# Patient Record
Sex: Male | Born: 1945 | Race: White | Hispanic: No | Marital: Married | Smoking: Never smoker
Health system: Southern US, Community
[De-identification: ages and names within clinical notes are randomized; demographics above are authoritative.]

## PROBLEM LIST (undated history)

## (undated) DIAGNOSIS — E785 Hyperlipidemia, unspecified: Secondary | ICD-10-CM

## (undated) DIAGNOSIS — C140 Malignant neoplasm of pharynx, unspecified: Secondary | ICD-10-CM

## (undated) DIAGNOSIS — I10 Essential (primary) hypertension: Secondary | ICD-10-CM

## (undated) DIAGNOSIS — I82409 Acute embolism and thrombosis of unspecified deep veins of unspecified lower extremity: Secondary | ICD-10-CM

## (undated) DIAGNOSIS — R569 Unspecified convulsions: Secondary | ICD-10-CM

## (undated) HISTORY — PX: CHOLECYSTECTOMY: SHX55

## (undated) HISTORY — PX: ORTHOPEDIC SURGERY: SHX850

---

## 2012-11-24 NOTE — Procedures (Signed)
 POST-OPERATIVE/POST-PROCEDURE NOTE Patient Name: Derek Booker Patient MRN: [id number] Patient DOB: 01/25/1946  Service date: 11/24/2012 PCP: LAMON ALTO POTTERS, MD  Surgeon: BERNARDINO ELSIE DECREE, MD Assistant Surgeon: None  Pre-operative diagnosis: Bladder tumor, BPH with obstruction Post-operative diagnosis: same  Procedure/description: TURBT (< 2 cm), TURP  Operative findings: Left lateral wall tumor (low grade appearing), Bilobar hypertrophy  Specimens: Left lateral wall bladder tumor, prostate chips  Fluids/Blood: LR  Estimated Blood Loss:  Minimal  Drains/Packs: 24 Fr 3-way catheter   Patient's condition: Stable    BERNARDINO ELSIE DECREE, MD 11/24/2012 8:09 AM

## 2013-07-31 DIAGNOSIS — I82409 Acute embolism and thrombosis of unspecified deep veins of unspecified lower extremity: Secondary | ICD-10-CM | POA: Insufficient documentation

## 2013-07-31 DIAGNOSIS — D751 Secondary polycythemia: Secondary | ICD-10-CM | POA: Insufficient documentation

## 2014-10-05 DIAGNOSIS — C109 Malignant neoplasm of oropharynx, unspecified: Secondary | ICD-10-CM | POA: Insufficient documentation

## 2014-11-09 DIAGNOSIS — F40240 Claustrophobia: Secondary | ICD-10-CM | POA: Insufficient documentation

## 2014-11-30 DIAGNOSIS — F331 Major depressive disorder, recurrent, moderate: Secondary | ICD-10-CM | POA: Insufficient documentation

## 2014-12-20 DIAGNOSIS — F410 Panic disorder [episodic paroxysmal anxiety] without agoraphobia: Secondary | ICD-10-CM | POA: Insufficient documentation

## 2016-06-02 DIAGNOSIS — M5481 Occipital neuralgia: Secondary | ICD-10-CM | POA: Insufficient documentation

## 2016-06-02 DIAGNOSIS — G43019 Migraine without aura, intractable, without status migrainosus: Secondary | ICD-10-CM | POA: Insufficient documentation

## 2016-12-02 ENCOUNTER — Encounter (HOSPITAL_COMMUNITY): Payer: Self-pay | Admitting: Emergency Medicine

## 2016-12-02 ENCOUNTER — Emergency Department (HOSPITAL_COMMUNITY): Payer: Medicare PPO

## 2016-12-02 ENCOUNTER — Observation Stay (HOSPITAL_COMMUNITY)
Admission: EM | Admit: 2016-12-02 | Discharge: 2016-12-03 | Disposition: A | Discharge status: Home / Self Care | Payer: Medicare PPO | Attending: Internal Medicine | Admitting: Internal Medicine

## 2016-12-02 DIAGNOSIS — Z7901 Long term (current) use of anticoagulants: Secondary | ICD-10-CM | POA: Diagnosis not present

## 2016-12-02 DIAGNOSIS — N400 Enlarged prostate without lower urinary tract symptoms: Secondary | ICD-10-CM | POA: Diagnosis present

## 2016-12-02 DIAGNOSIS — Z8669 Personal history of other diseases of the nervous system and sense organs: Secondary | ICD-10-CM

## 2016-12-02 DIAGNOSIS — Z7982 Long term (current) use of aspirin: Secondary | ICD-10-CM | POA: Diagnosis not present

## 2016-12-02 DIAGNOSIS — R072 Precordial pain: Principal | ICD-10-CM | POA: Insufficient documentation

## 2016-12-02 DIAGNOSIS — Z86718 Personal history of other venous thrombosis and embolism: Secondary | ICD-10-CM

## 2016-12-02 DIAGNOSIS — I10 Essential (primary) hypertension: Secondary | ICD-10-CM | POA: Diagnosis not present

## 2016-12-02 DIAGNOSIS — Z79899 Other long term (current) drug therapy: Secondary | ICD-10-CM | POA: Insufficient documentation

## 2016-12-02 DIAGNOSIS — Z85819 Personal history of malignant neoplasm of unspecified site of lip, oral cavity, and pharynx: Secondary | ICD-10-CM | POA: Diagnosis not present

## 2016-12-02 DIAGNOSIS — E039 Hypothyroidism, unspecified: Secondary | ICD-10-CM | POA: Diagnosis not present

## 2016-12-02 DIAGNOSIS — F329 Major depressive disorder, single episode, unspecified: Secondary | ICD-10-CM | POA: Diagnosis present

## 2016-12-02 DIAGNOSIS — E785 Hyperlipidemia, unspecified: Secondary | ICD-10-CM | POA: Diagnosis present

## 2016-12-02 DIAGNOSIS — R0789 Other chest pain: Secondary | ICD-10-CM

## 2016-12-02 DIAGNOSIS — R079 Chest pain, unspecified: Secondary | ICD-10-CM | POA: Diagnosis present

## 2016-12-02 DIAGNOSIS — I440 Atrioventricular block, first degree: Secondary | ICD-10-CM | POA: Diagnosis present

## 2016-12-02 DIAGNOSIS — F32A Depression, unspecified: Secondary | ICD-10-CM | POA: Diagnosis present

## 2016-12-02 HISTORY — DX: Essential (primary) hypertension: I10

## 2016-12-02 HISTORY — DX: Hyperlipidemia, unspecified: E78.5

## 2016-12-02 LAB — BASIC METABOLIC PANEL
Anion gap: 9 (ref 5–15)
BUN: 12 mg/dL (ref 6–20)
CO2: 24 mmol/L (ref 22–32)
CREATININE: 1.22 mg/dL (ref 0.61–1.24)
Calcium: 9.5 mg/dL (ref 8.9–10.3)
Chloride: 109 mmol/L (ref 101–111)
GFR, EST NON AFRICAN AMERICAN: 59 mL/min — AB (ref >60)
Glucose, Bld: 114 mg/dL — ABNORMAL HIGH (ref 65–99)
Potassium: 4.3 mmol/L (ref 3.5–5.1)
SODIUM: 142 mmol/L (ref 135–145)

## 2016-12-02 LAB — CBC
HCT: 43.7 % (ref 39.0–52.0)
Hemoglobin: 14.8 g/dL (ref 13.0–17.0)
MCH: 32.7 pg (ref 26.0–34.0)
MCHC: 33.9 g/dL (ref 30.0–36.0)
MCV: 96.5 fL (ref 78.0–100.0)
PLATELETS: 169 10*3/uL (ref 150–400)
RBC: 4.53 MIL/uL (ref 4.22–5.81)
RDW: 13.1 % (ref 11.5–15.5)
WBC: 4.4 10*3/uL (ref 4.0–10.5)

## 2016-12-02 LAB — I-STAT TROPONIN, ED: Troponin i, poc: 0 ng/mL (ref 0.00–0.08)

## 2016-12-02 LAB — TROPONIN I: Troponin I: <0.03 ng/mL (ref <0.03)

## 2016-12-02 MED ORDER — ASPIRIN 81 MG PO CHEW
81.0000 mg | CHEWABLE_TABLET | Freq: Every day | ORAL | Status: DC
  Administered 2016-12-03: 81 mg via ORAL
  Filled 2016-12-02: qty 1

## 2016-12-02 MED ORDER — SERTRALINE HCL 50 MG PO TABS
50.0000 mg | ORAL_TABLET | Freq: Every day | ORAL | Status: DC
  Administered 2016-12-03: 50 mg via ORAL
  Filled 2016-12-02: qty 1

## 2016-12-02 MED ORDER — PANTOPRAZOLE SODIUM 40 MG PO TBEC
40.0000 mg | DELAYED_RELEASE_TABLET | Freq: Every day | ORAL | Status: DC
  Administered 2016-12-03: 40 mg via ORAL
  Filled 2016-12-02: qty 1

## 2016-12-02 MED ORDER — SODIUM CHLORIDE 0.9% FLUSH: 3.0000 mL | Freq: Two times a day (BID) | INTRAVENOUS | Status: DC

## 2016-12-02 MED ORDER — ACETAMINOPHEN 325 MG PO TABS: 650.0000 mg | ORAL_TABLET | Freq: Four times a day (QID) | ORAL | Status: DC | PRN

## 2016-12-02 MED ORDER — TOPIRAMATE 100 MG PO TABS: 50.0000 mg | ORAL_TABLET | Freq: Every day | ORAL | Status: DC

## 2016-12-02 MED ORDER — NITROGLYCERIN 2 % TD OINT
1.0000 [in_us] | TOPICAL_OINTMENT | Freq: Once | TRANSDERMAL | Status: AC
  Administered 2016-12-02: 1 [in_us] via TOPICAL
  Filled 2016-12-02: qty 1

## 2016-12-02 MED ORDER — TOPIRAMATE 100 MG PO TABS
50.0000 mg | ORAL_TABLET | Freq: Every day | ORAL | Status: DC
  Administered 2016-12-02: 50 mg via ORAL
  Filled 2016-12-02: qty 1

## 2016-12-02 MED ORDER — PRAVASTATIN SODIUM 40 MG PO TABS
40.0000 mg | ORAL_TABLET | Freq: Every day | ORAL | Status: DC
  Administered 2016-12-03: 40 mg via ORAL
  Filled 2016-12-02: qty 1

## 2016-12-02 MED ORDER — BENAZEPRIL HCL 10 MG PO TABS
20.0000 mg | ORAL_TABLET | Freq: Every day | ORAL | Status: DC
Start: 2016-12-03 — End: 2016-12-03
  Administered 2016-12-03: 20 mg via ORAL
  Filled 2016-12-02: qty 2

## 2016-12-02 MED ORDER — ACETAMINOPHEN 650 MG RE SUPP: 650.0000 mg | Freq: Four times a day (QID) | RECTAL | Status: DC | PRN

## 2016-12-02 MED ORDER — RIVAROXABAN 20 MG PO TABS
20.0000 mg | ORAL_TABLET | Freq: Every day | ORAL | Status: DC
  Administered 2016-12-03: 20 mg via ORAL
  Filled 2016-12-02: qty 1

## 2016-12-02 MED ORDER — NITROGLYCERIN 0.4 MG SL SUBL
0.4000 mg | SUBLINGUAL_TABLET | SUBLINGUAL | Status: DC | PRN
  Administered 2016-12-02: 0.4 mg via SUBLINGUAL
  Filled 2016-12-02: qty 1

## 2016-12-02 MED ORDER — LEVOTHYROXINE SODIUM 112 MCG PO TABS
112.0000 ug | ORAL_TABLET | Freq: Every day | ORAL | Status: DC
  Administered 2016-12-03: 112 ug via ORAL
  Filled 2016-12-02 (×2): qty 1

## 2016-12-02 MED ORDER — AMLODIPINE BESYLATE 5 MG PO TABS
5.0000 mg | ORAL_TABLET | Freq: Every day | ORAL | Status: DC
  Administered 2016-12-03: 5 mg via ORAL
  Filled 2016-12-02: qty 1

## 2016-12-02 MED ORDER — SUMATRIPTAN SUCCINATE 100 MG PO TABS
100.0000 mg | ORAL_TABLET | Freq: Once | ORAL | Status: AC
  Administered 2016-12-02: 100 mg via ORAL
  Filled 2016-12-02: qty 1

## 2016-12-02 MED ORDER — DARIFENACIN HYDROBROMIDE ER 15 MG PO TB24
15.0000 mg | ORAL_TABLET | Freq: Every day | ORAL | Status: DC
  Administered 2016-12-03: 15 mg via ORAL
  Filled 2016-12-02: qty 1

## 2016-12-02 NOTE — ED Provider Notes (Signed)
[facility]-EMERGENCY DEPT Provider Note   CSN: [id number] Arrival date & time: 12/02/16  4098     History   Chief Complaint Chief Complaint  Patient presents with  . Chest Pain    HPI Derek Booker is a 71 y.o. male.  HPI   71 yo M with PMHx HTN, HLD, family h/o CAD here wth left sided CP. Pt states he was in usual state of health until this morning. He woke up, walked to the bathroom, and walked to bed around 5:30 AM. Upon returning to bed, he had gradual onset of an aching, dull, crushing substernal CP with radiation to his left arm. He had associated SOB but no nausea or vomiting. He continues to have mild, 1/10 chest pain at this time. He sleeps on his left side and thought this was muscular but is different from any MSK pain he has had before. No LE edema. No h/o heart disease. Of note, family does have h/o aortic disease and heart disease. Denies any alleviating factors. His pain did seem to worsen with exertion and movement.  Past Medical History:  Diagnosis Date  . Hyperlipidemia   . Hypertension     Patient Active Problem List   Diagnosis Date Noted  . Chest pain 12/02/2016    Past Surgical History:  Procedure Laterality Date  . CHOLECYSTECTOMY    . ORTHOPEDIC SURGERY         Home Medications    Prior to Admission medications   Medication Sig Start Date End Date Taking? Authorizing Provider  amLODipine-benazepril (LOTREL) 5-20 MG capsule Take 1 capsule by mouth daily.   Yes Historical Provider, MD  aspirin 81 MG chewable tablet Chew 81 mg by mouth daily.   Yes Historical Provider, MD  levothyroxine (SYNTHROID, LEVOTHROID) 112 MCG tablet Take 112 mcg by mouth daily before breakfast.   Yes Historical Provider, MD  omeprazole (PRILOSEC) 20 MG capsule Take 20 mg by mouth daily.   Yes Historical Provider, MD  pravastatin (PRAVACHOL) 40 MG tablet Take 40 mg by mouth daily.   Yes Historical Provider, MD  rivaroxaban (XARELTO) 20 MG TABS tablet Take 20 mg by mouth daily.    Yes Historical Provider, MD  sertraline (ZOLOFT) 50 MG tablet Take 50 mg by mouth daily.   Yes Historical Provider, MD  solifenacin (VESICARE) 5 MG tablet Take 5 mg by mouth daily.   Yes Historical Provider, MD  topiramate (TOPAMAX) 50 MG tablet Take 50 mg by mouth daily.   Yes Historical Provider, MD    Family History History reviewed. No pertinent family history.  Social History Social History  Substance Use Topics  . Smoking status: Never Smoker  . Smokeless tobacco: Never Used  . Alcohol use No     Allergies   Nitrates, organic   Review of Systems Review of Systems  Constitutional: Negative for chills, fatigue and fever.  HENT: Negative for congestion and rhinorrhea.   Eyes: Negative for visual disturbance.  Respiratory: Positive for chest tightness and shortness of breath. Negative for cough and wheezing.   Cardiovascular: Positive for chest pain. Negative for leg swelling.  Gastrointestinal: Negative for abdominal pain, diarrhea, nausea and vomiting.  Genitourinary: Negative for dysuria and flank pain.  Musculoskeletal: Negative for neck pain and neck stiffness.  Skin: Negative for rash and wound.  Allergic/Immunologic: Negative for immunocompromised state.  Neurological: Negative for syncope, weakness and headaches.  All other systems reviewed and are negative.    Physical Exam Updated Vital Signs BP 136/90 (BP  Location: Right Arm)   Pulse 63   Temp 97.7 F (36.5 C) (Oral)   Resp 16   Ht 6' (1.829 m)   Wt 190 lb (86.2 kg)   SpO2 100%   BMI 25.77 kg/m   Physical Exam  Constitutional: He is oriented to person, place, and time. He appears well-developed and well-nourished. No distress.  HENT:  Head: Normocephalic and atraumatic.  Eyes: Conjunctivae are normal.  Neck: Neck supple.  Cardiovascular: Normal rate, regular rhythm and normal heart sounds.  Exam reveals no friction rub.   No murmur heard. Pulmonary/Chest: Effort normal and breath sounds normal.  No respiratory distress. He has no wheezes. He has no rales.  Abdominal: He exhibits no distension.  Musculoskeletal: He exhibits no edema.  Neurological: He is alert and oriented to person, place, and time. He exhibits normal muscle tone.  Skin: Skin is warm. Capillary refill takes less than 2 seconds.  Psychiatric: He has a normal mood and affect.  Nursing note and vitals reviewed.    ED Treatments / Results  Labs (all labs ordered are listed, but only abnormal results are displayed) Labs Reviewed  BASIC METABOLIC PANEL - Abnormal; Notable for the following:       Result Value   Glucose, Bld 114 (*)    GFR calc non Af Amer 59 (*)    All other components within normal limits  CBC  TROPONIN I  I-STAT TROPOININ, ED    EKG  EKG Interpretation  Date/Time:  Wednesday December 02 2016 07:12:25 EDT Ventricular Rate:  54 PR Interval:    QRS Duration: 95 QT Interval:  448 QTC Calculation: 425 R Axis:   16 Text Interpretation:  Sinus rhythm Prolonged PR interval DELAYED R WAVE PROGRESSION Otherwise no acute ST elevations or depressions Confirmed by Erma Heritage MD, Sheria Lang [id number]) on 12/02/2016 7:18:55 AM       Radiology Dg Chest 2 View  Result Date: 12/02/2016 CLINICAL DATA:  71 year old with acute onset of left-sided chest pain radiating into the left shoulder and arm associated with shortness of breath. Symptoms began approximately 2 hours ago. Nonsmoker. EXAM: CHEST  2 VIEW COMPARISON:  None. FINDINGS: Cardiomediastinal silhouette unremarkable. Left apical pleuroparenchymal opacity likely due to scarring. Lungs otherwise clear. Bronchovascular markings normal. Pulmonary vascularity normal. No pleural effusions. No pneumothorax. Degenerative changes involving the thoracic spine. IMPRESSION: 1. Left apical pleuroparenchymal opacity likely representing scarring. In the absence of prior examinations for comparison, a follow-up chest x-ray in 2-3 months is recommended to confirm stability. 2.   No acute cardiopulmonary disease. Electronically Signed   By: Hulan Saas M.D.   On: 12/02/2016 07:33    Procedures Procedures (including critical care time)  Medications Ordered in ED Medications  nitroGLYCERIN (NITROSTAT) SL tablet 0.4 mg (0.4 mg Sublingual Given 12/02/16 0741)  nitroGLYCERIN (NITROGLYN) 2 % ointment 1 inch (1 inch Topical Given 12/02/16 0857)     Initial Impression / Assessment and Plan / ED Course  I have reviewed the triage vital signs and the nursing notes.  Pertinent labs & imaging results that were available during my care of the patient were reviewed by me and considered in my medical decision making (see chart for details).    71 yo M with PMHx of HTN, HLD here with left-sided CP radiating to his arm. Pt hypertensive, bradycardic on arrival. EKG non-ischemic but does show some non-specific ST changes in inferior and anterior leads. No overt ST elevations or depressions. Lab work unremarkable and initial trop neg.  CXR with possible scarring left apex, o/w unremarkable. No PTX. HEAR score is 5 - will likely need provocative testing and rule out. Pain improved with nitro. Will admit to IM Teaching for high-risk CP rule out.  Final Clinical Impressions(s) / ED Diagnoses   Final diagnoses:  Other chest pain  Essential hypertension    New Prescriptions New Prescriptions   No medications on file     Shaune Pollack, MD 12/02/16 [phone]

## 2016-12-02 NOTE — H&P (Signed)
Date: 12/02/2016               Patient Name:  Derek Booker MRN: [id number]  DOB: 1945-10-29 Age / Sex: 71 y.o.,  male   PCP: Pcp Not In System         Medical Service: Internal Medicine Teaching Service         Attending Physician: Dr. Earl Lagos, MD    First Contact: Dr. Mikey Bussing Pager: [phone]  Second Contact: Dr. 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  Patient states at 2 AM he awoke from sleep to use the restroom. When he laid back down in bed, he experienced an intense pain in his left proximal anterior medial arm radiating superiorly and down into his left medial chest. He cannot describe the pain more than "intense." The pain was not associated with diaphoresis, nausea, vomiting, palpitations, lightheadedness or shortness of breath. Pain was constant and has not fully resolved. He is unable to determine what makes the pain worse. He denies worsening of the pain with inspiration or with movement. Pain was reproducible when the EDP palpated the chest wall and arm. Pain did improve with aspirin and nitroglycerin. At its onset, pain was intense at a 6 or 7/10. Patient received aspirin 325 mg with improvement of his pain to a 2/10. After receiving nitroglycerin in the emergency department, pain again improved to a 1/10. Patient states that the night before last, he had an episode of similar left arm pain when he was laying on his left shoulder during sleep. The pain at that time was a 1/10. As soon as he turned over and remove pressure from the left side,  the pain resolved. He states that this episode was similar to what he experienced last night, but was not as intense. Patient denies pleuritic chest pain, shortness of breath, lower extremity edema or pain, upper extremity edema. He reports compliance with his daily Xarelto. He denies fever, cough, congestion, dysphagia, reflux or abdominal pain. Patient does have a history of arthritis and has undergone several shoulder arthroscopies and bilateral knee partial replacement in the past. He denies any new activities or exercises.  Patient has a history of hypertension, hyperlipidemia. He is a nonsmoker. He has a family history of atherosclerosis as both his father and his uncle both died from aortic aneurysm ruptures. He is not aware if they had CAD. Patient is fairly active at baseline, but is limited with shortness of breath. He exercises, walks frequently, lifts weights, does pushups and will run occasionally. He states that shortness of breath with exertion will limit him, but improves with rest. He denies ever experiencing chest pain with exertion or at rest. Left and right heart catheterization was performed in January 2015 given abnormal stress test showing inferior ischemia, intermediate risk. Heart catheterization showed no significant coronary artery disease. Torturous vessel, likely secondary to hypertension. Normal LV systolic function. Ejection fraction 60%. Echocardiogram from March 2018 showed normal wall motion with normal LV systolic function with LVEF of 55-60%. Borderline concentric  LVH. Grade 1 diastolic dysfunction. Normal right heart size and right ventricle function normal valvular apparatus. Mildly sclerotic aortic valve without stenosis. Trace aortic regurgitation. Trace mitral regurgitation. Trace tricuspid regurgitation. No evidence of pericardial effusion. Patient was recently seen by his cardiologist on 10/26/2016 and at that time reported no chest pain and no change in dyspnea.  Cardiology recommended continuing current medications given good control. EKG was noted to be sinus rhythm with ST elevations due to repolarization changes which was similar to priors.  In the emergency department, patient was afebrile, hypertensive at 160/107, heart rate 56, respiratory rate 13, satting 100% on room air. After nitroglycerin, blood pressure improved to 136/90. Basic metabolic panel and CBC were within normal limits. Troponin was negative. EKG was sinus rhythm without ST or T-wave changes, but with 1st degree AV block. Chest x-ray showed and opacity of the left apical pleuroparenchymal likely representing scarring. Patient received aspirin 325 mg, nitroglycerin and nitroglycerin paste.  Meds: Current Facility-Administered Medications  Medication Dose Route Frequency Provider Last Rate Last Dose  . acetaminophen (TYLENOL) tablet 650 mg  650 mg Oral Q6H PRN Batu Cassin Lucrezia Starch, MD       Or  . acetaminophen (TYLENOL) suppository 650 mg  650 mg Rectal Q6H PRN Wyolene Weimann Lucrezia Starch, MD      . nitroGLYCERIN (NITROSTAT) SL tablet 0.4 mg  0.4 mg Sublingual Q5 min PRN Shaune Pollack, MD   0.4 mg at 12/02/16 0741  . [START ON 12/03/2016] topiramate (TOPAMAX) tablet 50 mg  50 mg Oral Daily Davonne Jarnigan Lucrezia Starch, MD       Current Outpatient Prescriptions  Medication Sig Dispense Refill  . amLODipine-benazepril (LOTREL) 5-20 MG capsule Take 1 capsule by mouth daily.    Marland Kitchen aspirin 81 MG chewable tablet Chew 81 mg by mouth daily.    Marland Kitchen levothyroxine (SYNTHROID, LEVOTHROID) 112 MCG tablet Take 112 mcg by mouth daily before breakfast.    . omeprazole (PRILOSEC) 20 MG capsule Take 20 mg by mouth daily.    . pravastatin (PRAVACHOL) 40 MG tablet Take 40 mg by mouth daily.    . rivaroxaban (XARELTO) 20 MG TABS tablet Take 20 mg by mouth daily.    . sertraline (ZOLOFT) 50 MG tablet Take 50 mg by mouth daily.    . solifenacin (VESICARE) 5 MG tablet Take 5 mg by mouth daily.    Marland Kitchen topiramate (TOPAMAX) 50 MG tablet Take 50 mg  by mouth daily.      Allergies: Allergies as of 12/02/2016 - Review Complete 12/02/2016  Allergen Reaction Noted  . Nitrates, organic Other (See Comments) 12/02/2016   Past Medical History:  Diagnosis Date  . Hyperlipidemia   . Hypertension     Family History:  Father: Aortic aneurysm rupture Uncle: Aortic aneurysm rupture  Social History:  Tobacco Use: Denies Alcohol Use: Denies Illicit Drug Use: Denies  Review of Systems: A complete ROS was reviewed and negative except as per HPI.   Physical Exam: Blood pressure 123/88, pulse 60, temperature 97.7 F (36.5 C), temperature source Oral, resp. rate 13, height 6' (1.829 m), weight 190 lb (86.2 kg), SpO2 98 %. General: Vital signs reviewed.  Patient is in no acute distress and cooperative with exam.  Eyes: PERRL, conjunctivae normal, no scleral icterus.  Ears, Nose, Throat, and Mouth: Normal bilateral tympanic membranes. Normal bilateral nasal turbinates. Normal posterior oropharynx. Moist mucous membranes.  Cardiovascular: RRR,  no murmurs, gallops, or rubs. No JVD or carotid bruit present. No lower extremity edema bilaterally.  Bilateral radial and pedal pulses are intact and symmetric bilaterally. Normal capillary refill.  Pulmonary: Clear to auscultation bilaterally, no wheezes, rales, or rhonchi. No accessory muscle use. Gastrointestinal: Soft, non-tender, non-distended, BS +, no guarding present.  Musculoskeletal: No erythema, non-tender on palpation of left inner arm and chest wall. Neurologic: Awake and alert, answers all questions appropriately, moves all extremities equally. Strength is normal and symmetric bilaterally in upper extremities, sensory intact to light touch bilaterally.  Skin: Warm, dry and intact. No rashes or erythema. Psychiatric: Normal mood and affect. speech and behavior is normal. Cognition and memory are normal.   EKG: 1st degree AV Block. NSR. No ST changes or TWI.  CXR:  Left apical  pleuroparenchymal opacity likely representing scarring. In the absence of prior examinations for comparison, a follow-up chest x-ray in 2-3 months is recommended to confirm stability. No widening of the mediastinum.   Exercise Stress Test prior to January 2015: abnormal stress test showing inferior ischemia, intermediate risk.  Left and right heart catheterization January 2015: Heart catheterization showed no significant coronary artery disease. Torturous vessel, likely secondary to hypertension. Normal LV systolic function. Ejection fraction 60%.  Echocardiogram from March 2018: normal wall motion with normal LV systolic function with LVEF of 55-60%. Borderline concentric LVH. Grade 1 diastolic dysfunction. Normal right heart size and right ventricle function normal valvular apparatus. Mildly sclerotic aortic valve without stenosis. Trace aortic regurgitation. Trace mitral regurgitation. Trace tricuspid regurgitation. No evidence of pericardial effusion.  Assessment & Plan by Problem: Principal Problem:   Chest pain Active Problems:   HTN (hypertension)   HLD (hyperlipidemia)   Hypothyroidism   History of throat cancer   History of recurrent deep vein thrombosis (DVT)   BPH (benign prostatic hyperplasia)   Depression   History of migraine   1st degree AV block  Mr. Keetch is a 71 M with PMHx of HTN, HLD, BPH s/p TURP, Throat Cancer s/p Radiation in 2016, Hypothyroidism, GERD, Depression, H/o recurrent DVT on Xarelto, and Migraine Headaches who presents to the ED with complaint of left arm pain.  Left Arm and Chest Pain: Patient presents with anterior medial proximal left arm pain radiating to the proximal medial left chest wall while laying in bed and not associated with shortness of breath, diaphoresis, nausea, vomiting or lightheadedness. No history of chest pain. Heart catheterization in January 2015 showed no significant coronary artery disease. Echocardiogram in March 2018 showed no  wall motion abnormalities and a normal left ventricular injection fraction. Troponin negative. EKG without ischemic changes. Doubt pulmonary embolism, pneumonia, pneumothorax, aortic dissection. Heart score 5. Overall, patient's presentation is very atypical of ACS, but he does have several risk factors. Will admit patient for observation, troponin trend and repeat EKG tomorrow morning. Given patient's recent heart catheterization, we'll discuss if he requires stress testing. -Observation for chest pain rule out -Trend troponins -Repeat EKG tomorrow morning -Continue aspirin 81 mg daily -Continue Nitropaste and nitroglycerin when necessary -Obtain fasting lipid panel and hemoglobin A1c  Apical scarring on chest x-ray: Chest x-ray showed consolidation in left apical pleuroparenchyma, likely representing scarring. Recommendations are to repeat chest x-ray in 2-3 months. I discussed this with the patient and his family. Patient denies cough, shortness of breath, hemoptysis. -Upon returning to [place], patient should have a repeat chest x-ray in 2-3 months  HTN: Patient has a history of hypertension and is on amlodipine 5 mg daily and benazepril 20 mg daily at home. Patient was hypertensive on arrival at 160/107 which has  improved to 136/90. -Continue amlodipine 5 mg daily -Continue benazepril 20 mg daily -Nitroglycerin paste  HLD: Patient has a history of hyperlipidemia and is on pravastatin 40 mg daily. -Calculate ASCVD -Continue pravastatin 40 mg daily -Check a morning lipid panel  Hypothyroidism: Patient has a history of hypothyroidism and is on Synthroid 112 g daily. 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 Patient is status post radiation therapy and has done well. Patient follows closely with his oncologist in [place] and reports he recently had a  scope one month ago which showed no evidence of recurrence. He denies dysphagia.  BPH s/p TURP: Patient has a history of BPH and is status post TURP in 2014. Patient is on Vesicare 10 mg daily at home. Patient has chronic urinary hesitancy which is unchanged. He denies dysuria. -Continue formulary version of Vesicare 10 mg daily while admitted  H/o Recurrent DVT on Xarelto: Patient has a history of DVT, first diagnosed 20-25 years ago in his left lower extremity. Patient had a recurrence of DVT in his left lower extremity 2-3 years ago prior to the diagnosis of his throat cancer. Patient has remained on Xarelto 20 mg daily since diagnosis and reports good compliance. He denies history of pulmonary embolism. He denies lower extremity or upper extremity edema, denies shortness of breath, denies pleuritic chest pain. Physical exam shows no evidence of edema, erythema or tenderness of palpation in all 4 extremities. Doubt recurrence of DVT or PE. -Continue Xarelto 20 mg daily  Depression: Patient is on sertraline 50 mg daily at home. -Continue sertraline 50 mg daily  GERD: Patient is on omeprazole 20 mg daily at home. -Protonix 40 mg daily  Migraine History: -Continue home Topiramate   DVT/PE ppx: Xarelto FEN: HH CODE: FULL  Dispo: Admit patient to Observation with expected length of stay less than 2 midnights.  Signed: Karlene Lineman, DO PGY-3 Internal Medicine Resident Pager # [phone] 12/02/2016 10:51 AM

## 2016-12-02 NOTE — ED Triage Notes (Signed)
Pt c/o 2/10 cp radiating to his left arm that started today, pt got 324 mg ASA pta to ED.

## 2016-12-03 DIAGNOSIS — I440 Atrioventricular block, first degree: Secondary | ICD-10-CM

## 2016-12-03 DIAGNOSIS — R0789 Other chest pain: Secondary | ICD-10-CM | POA: Diagnosis not present

## 2016-12-03 DIAGNOSIS — Z86718 Personal history of other venous thrombosis and embolism: Secondary | ICD-10-CM

## 2016-12-03 DIAGNOSIS — E039 Hypothyroidism, unspecified: Secondary | ICD-10-CM | POA: Diagnosis not present

## 2016-12-03 DIAGNOSIS — M79602 Pain in left arm: Secondary | ICD-10-CM | POA: Diagnosis not present

## 2016-12-03 DIAGNOSIS — Z85818 Personal history of malignant neoplasm of other sites of lip, oral cavity, and pharynx: Secondary | ICD-10-CM

## 2016-12-03 DIAGNOSIS — G43909 Migraine, unspecified, not intractable, without status migrainosus: Secondary | ICD-10-CM | POA: Diagnosis not present

## 2016-12-03 DIAGNOSIS — N400 Enlarged prostate without lower urinary tract symptoms: Secondary | ICD-10-CM | POA: Diagnosis not present

## 2016-12-03 DIAGNOSIS — R072 Precordial pain: Secondary | ICD-10-CM | POA: Diagnosis not present

## 2016-12-03 DIAGNOSIS — I10 Essential (primary) hypertension: Secondary | ICD-10-CM

## 2016-12-03 DIAGNOSIS — Z7901 Long term (current) use of anticoagulants: Secondary | ICD-10-CM | POA: Diagnosis not present

## 2016-12-03 DIAGNOSIS — E785 Hyperlipidemia, unspecified: Secondary | ICD-10-CM

## 2016-12-03 DIAGNOSIS — F329 Major depressive disorder, single episode, unspecified: Secondary | ICD-10-CM | POA: Diagnosis not present

## 2016-12-03 DIAGNOSIS — Z923 Personal history of irradiation: Secondary | ICD-10-CM

## 2016-12-03 DIAGNOSIS — Z79899 Other long term (current) drug therapy: Secondary | ICD-10-CM

## 2016-12-03 DIAGNOSIS — Z888 Allergy status to other drugs, medicaments and biological substances status: Secondary | ICD-10-CM

## 2016-12-03 LAB — LIPID PANEL
CHOL/HDL RATIO: 3.3 ratio
Cholesterol: 130 mg/dL (ref 0–200)
HDL: 40 mg/dL — AB (ref >40)
LDL CALC: 73 mg/dL (ref 0–99)
Triglycerides: 87 mg/dL (ref <150)
VLDL: 17 mg/dL (ref 0–40)

## 2016-12-03 LAB — BASIC METABOLIC PANEL
Anion gap: 6 (ref 5–15)
BUN: 12 mg/dL (ref 6–20)
CHLORIDE: 110 mmol/L (ref 101–111)
CO2: 23 mmol/L (ref 22–32)
CREATININE: 1.25 mg/dL — AB (ref 0.61–1.24)
Calcium: 9.2 mg/dL (ref 8.9–10.3)
GFR calc non Af Amer: 57 mL/min — ABNORMAL LOW (ref >60)
Glucose, Bld: 107 mg/dL — ABNORMAL HIGH (ref 65–99)
POTASSIUM: 3.8 mmol/L (ref 3.5–5.1)
Sodium: 139 mmol/L (ref 135–145)

## 2016-12-03 LAB — CBC
HEMATOCRIT: 40.5 % (ref 39.0–52.0)
Hemoglobin: 13.6 g/dL (ref 13.0–17.0)
MCH: 32.6 pg (ref 26.0–34.0)
MCHC: 33.6 g/dL (ref 30.0–36.0)
MCV: 97.1 fL (ref 78.0–100.0)
PLATELETS: 168 10*3/uL (ref 150–400)
RBC: 4.17 MIL/uL — AB (ref 4.22–5.81)
RDW: 13.5 % (ref 11.5–15.5)
WBC: 3.9 10*3/uL — AB (ref 4.0–10.5)

## 2016-12-03 LAB — HEMOGLOBIN A1C
HEMOGLOBIN A1C: 5.5 % (ref 4.8–5.6)
MEAN PLASMA GLUCOSE: 111 mg/dL

## 2016-12-03 NOTE — Progress Notes (Signed)
   Subjective: Patient was evaluated this morning on rounds. He denies any left arm pain or chest pain and said it had resolved overnight. He denies headaches at this time.  Objective:  Vital signs in last 24 hours: Vitals:   12/02/16 1158 12/02/16 1441 12/02/16 2130 12/03/16 0637  BP: (!) 131/100 (!) 149/94 116/87 131/87  Pulse: 67 (!) 57 (!) 55 (!) 52  Resp: 16 16    Temp: 97.8 F (36.6 C) 98.3 F (36.8 C) 98.4 F (36.9 C) 98.7 F (37.1 C)  TempSrc: Oral Oral Oral Oral  SpO2: 98% 100% 99% 100%  Weight: 187 lb 11.2 oz (85.1 kg)   186 lb 1.6 oz (84.4 kg)  Height: 6' (1.829 m)      Physical Exam  Constitutional: He is well-developed, well-nourished, and in no distress.  Cardiovascular: Normal rate, regular rhythm and normal heart sounds.  Exam reveals no gallop and no friction rub.   No murmur heard. Pulmonary/Chest: Effort normal and breath sounds normal. No respiratory distress. He has no wheezes. He has no rales.  Abdominal: Soft. He exhibits no distension.  Musculoskeletal: He exhibits no edema.  Skin: Skin is warm and dry.     Assessment/Plan:  Principal Problem:   Chest pain Active Problems:   HTN (hypertension)   HLD (hyperlipidemia)   Hypothyroidism   History of throat cancer   History of recurrent deep vein thrombosis (DVT)   BPH (benign prostatic hyperplasia)   Depression   History of migraine   1st degree AV block  Left Arm and Chest Pain Resolved overnight.  Troponins were negative X 3. EKG this morning was sinus rhythm without ST or T-wave changes, with 1st degree AV block, EKG unchanged from yesterday.  Pain most likely is musculoskeletal in nature.  Advised patient to follow up with PCP and cardiologist on discharge.   Apical scarring on chest x-ray Chest x-ray showed consolidation in left apical pleuroparenchyma, likely representing scarring. Recommendations are to repeat chest x-ray in 2-3 months. This was discussed once again with family.  -Upon  returning to [place], patient should have a repeat chest x-ray in 2-3 months  HTN Normotensive -Continue amlodipine 5 mg daily -Continue benazepril 20 mg daily  HLD Patient has a history of hyperlipidemia and is on pravastatin 40 mg daily.  LDL is 73 and total cholesterol is 130 on this admission -Continue pravastatin 40 mg daily  Hypothyroidism Patient has a history of hypothyroidism and is on Synthroid 112 g daily. 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 Patient is status post radiation therapy and has done well. Patient follows closely with his oncologist in [place] and reports he recently had a scope one month ago which showed no evidence of recurrence. He denies dysphagia.  BPH s/p TURP -Continue formulary version of Vesicare 10 mg daily while admitted  H/o Recurrent DVT on Xarelto:  No signs of DVT -Continue Xarelto 20 mg daily  Depression Patient is on sertraline 50 mg daily at home. -Continue sertraline 50 mg daily  GERD Patient is on omeprazole 20 mg daily at home. -Protonix 40 mg daily  Migraine History: Patient reports having a migraine yesterday and was given his home imitrex with relief.   - Imitrex  -Continue home Topiramate   Dispo: Anticipated discharge today.   Camelia Phenes, DO 12/03/2016, 12:03 PM Pager: [phone]

## 2016-12-03 NOTE — Progress Notes (Signed)
  Date: 12/03/2016  Patient name: Derek Booker  Medical record number: [id number]  Date of birth: November 30, 1945   I have seen and evaluated Derek Booker and discussed their care with the Residency Team. In brief, patient is a 71 year old male with a past medical history of hypertension, hyperlipidemia, BPH status post TURP, oropharyngeal cancer status post radiation in 2016, hypothyroidism, GERD, depression, recurrent DVT on anticoagulation and migraines who presented to the ED with left arm pain and chest pain 1 day.  Patient was feeling well until the day of admission he woke up in the middle of the night to use restroom and developed a sudden onset of left proximal arm pain which radiated to his left chest. Patient states that the pain was intense and was 7-8 out of 10, constant, no relation to inspiration, no associated diaphoresis, no nausea or vomiting, no palpitations, no shortness of breath, no syncope, no focal weakness. Patient did state that his pain improved after coming to the emergency department and receiving aspirin and nitroglycerin. Patient did have a similar episode of arm pain the day prior to admission which resolved as soon as he turned over. Patient denies fevers or chills, no recent URI symptoms, no swelling in his arm. Of note, patient had a coronary angiogram in January 2015 (after having an abnormal stress test showing intermediate risk) which showed no significant coronary disease. Patient also had an echocardiogram done in March 2018 which showed normal wall motion with normal LV systolic function with an LVEF of 55-60%.  Today patient feels well with no new complaints. States that his pain is completely resolved.  PMHx, Fam Hx, and/or Soc Hx : As per resident admit note  Vitals:   12/02/16 2130 12/03/16 0637  BP: 116/87 131/87  Pulse: (!) 55 (!) 52  Resp:    Temp: 98.4 F (36.9 C) 98.7 F (37.1 C)   Gen.: Awake alert and oriented 3, NAD CVS: Regular rate and rhythm,  normal heart sounds Lungs: CTA bilaterally Abdomen: Soft, nontender, nondistended, normoactive bowel sounds Extremities: No edema  Assessment and Plan: I have seen and evaluated the patient as outlined above. I agree with the formulated Assessment and Plan as detailed in the residents' note, with the following changes:   1. Left arm and chest pain: - Patient is 71 year old male who presented to the ED with sudden onset of left arm and chest pain in the setting of a history of hypertension and hyperlipidemia and was admitted for concern for ACS. His pain is very atypical for a cardiac event. Given that his pain is reproducible in the ED on admission it is likely that this is a musculoskeletal type of pain. Patient's pain is now completely resolved and he feels back to baseline.  - EKG with no acute ST or T-wave changes and was in normal sinus rhythm  - Troponins were negative 3 sets  - Patient had a recent echo with a normal EF with no wall motion abnormalities and no evidence of aortic stenosis in March 2018. I do not believe that a repeat echocardiogram at this time would change management.  - No further workup for now. Patient stable for discharge home today. - Of note, patient had chest x-ray which showed some consolidation in the left apical pleural parenchyma likely represents scarring. Patient will need repeat chest x-ray in 2-3 months. This is discussed with family and patient at bedside.   Earl Lagos, MD 4/26/20181:23 PM

## 2016-12-03 NOTE — Discharge Summary (Signed)
Name: Derek Booker MRN: [id number] DOB: 1945-12-11 71 y.o. PCP: Pcp Not In System  Date of Admission: 12/02/2016  6:58 AM Date of Discharge: 12/03/2016 Attending Physician: Dr. Heide Spark  Discharge Diagnosis: 1. Atypical Chest pain  2. Left arm pain  Principal Problem:   Chest pain Active Problems:   HTN (hypertension)   HLD (hyperlipidemia)   Hypothyroidism   History of throat cancer   History of recurrent deep vein thrombosis (DVT)   BPH (benign prostatic hyperplasia)   Depression   History of migraine   1st degree AV block   Discharge Medications: Allergies as of 12/03/2016      Reactions   Nitrates, Organic Other (See Comments)   headaches      Medication List    TAKE these medications   amLODipine-benazepril 5-20 MG capsule Commonly known as:  LOTREL Take 1 capsule by mouth daily.   aspirin 81 MG chewable tablet Chew 81 mg by mouth daily.   levothyroxine 112 MCG tablet Commonly known as:  SYNTHROID, LEVOTHROID Take 112 mcg by mouth daily before breakfast.   omeprazole 20 MG capsule Commonly known as:  PRILOSEC Take 20 mg by mouth daily.   pravastatin 40 MG tablet Commonly known as:  PRAVACHOL Take 40 mg by mouth daily.   rivaroxaban 20 MG Tabs tablet Commonly known as:  XARELTO Take 20 mg by mouth daily.   sertraline 50 MG tablet Commonly known as:  ZOLOFT Take 50 mg by mouth daily.   solifenacin 5 MG tablet Commonly known as:  VESICARE Take 5 mg by mouth daily.   topiramate 50 MG tablet Commonly known as:  TOPAMAX Take 50 mg by mouth daily.       Disposition and follow-up:   Mr.Jamarkus Garson was discharged from [facility] in good condition.  At the hospital follow up visit please address:  1.  Recurrence of chest pain/left arm pain.    2.  Labs / imaging needed at time of follow-up: Chest X-ray in 2-3 weeks to reassess consolidation in left apical pleuroparenchyma seen on chest x-ray on admission.  3.  Pending labs/  test needing follow-up: none  Follow-up Appointments: Primary Care Provider and Cardiologist   Hospital Course by problem list: Principal Problem:   Chest pain Active Problems:   HTN (hypertension)   HLD (hyperlipidemia)   Hypothyroidism   History of throat cancer   History of recurrent deep vein thrombosis (DVT)   BPH (benign prostatic hyperplasia)   Depression   History of migraine   1st degree AV block   Left Arm and Chest Pain On morning of admission patient experienced intense left arm pain that radiated to his chest.  EKG on admission showed 1st degree AV block, normal sinus rhythm with no ST changes.  EKG the following morning was unchanged. Left and right heart catheterization in January 2015 showed no significant coronary artery disease and normal LV systolic function with Ejection fraction 60%.   Patient stayed the night in the hospital and in the morning reported that his left sided pain had resolved.  Troponins were negative X 3.  Pain was most likely musculoskeletal in nature.  Advised patient to follow up with PCP and cardiologist on discharge.   Apical scarring on chest x-ray Chest x-ray showed consolidation in left apical pleuroparenchyma, likely representing scarring. Recommendations are to repeat chest x-ray in 2-3 months. This was discussed with patient and family.   Hypertension Patient was hypertensive on admission at 160/107.  Blood  pressures were 160-130s/80-100s.  Patient was continued on his home amlodipine  and benazepril  daily   Hyperlipidemia  Patient has a history of hyperlipidemia and is on pravastatin 40 mg daily.  LDL 73 and total cholesterol 130  this admission. Pravastatin 40 mg daily was continued.  Hypothyroidism Patient has a history of hypothyroidism and is on Synthroid 112 g daily.  Home medication was continued  H/o Oropharyngeal cancer stage III status post radiation in 2016 and inremission Patient has a history of throat  cancer which was diagnosed in 2015. Patient is status post radiation therapy and has done well. Patient follows closely with his oncologist in [place] and reports he recently had a scope one month ago which showed no evidence of recurrence. He denies dysphagia during admission.  Benign Prostatic Hyperplasia s/p TURP Continued formulary version of home Vesicare 10 mg daily while admitted  H/o Recurrent DVT on Xarelto: No signs of DVT during admission.  Patient was continued on Xarelto 20 mg daily.  Depression Patient is on sertraline 50 mg daily at home and was continued.  GERD Patient is on omeprazole 20 mg daily at home and this was continued.  Migraine History: Patient reports having a migraine day of admission and was given his home imitrex with relief.  Home topiramate was continued.   Discharge Vitals:   BP 131/87 (BP Location: Left Arm)   Pulse (!) 52   Temp 98.7 F (37.1 C) (Oral)   Resp 16   Ht 6' (1.829 m)   Wt 186 lb 1.6 oz (84.4 kg)   SpO2 100%   BMI 25.24 kg/m   Pertinent Labs, Studies, and Procedures:  EKG, chest x-ray   Signed: Camelia Phenes, DO 12/04/2016, 2:46 PM   Pager: [phone]

## 2016-12-03 NOTE — Discharge Instructions (Signed)
Derek Booker,  It was a pleasure to take care of you. We did not find any signs of a heart issue or heart attack. Your left arm pain radiating to your left chest is likely musculoskeletal. Please follow up with your primary care doctor and your Cardiologist. We will send your hospital results to them.

## 2018-11-02 IMAGING — CR DG CHEST 2V
2 series · 2 of 2 positions shown · non-contrast
Comparison: None.

CLINICAL DATA: 70-year-old with acute onset of left-sided chest
pain radiating into the left shoulder and arm associated with
shortness of breath. Symptoms began approximately 2 hours ago.
Nonsmoker.

EXAM:
CHEST  2 VIEW

[chest pa]
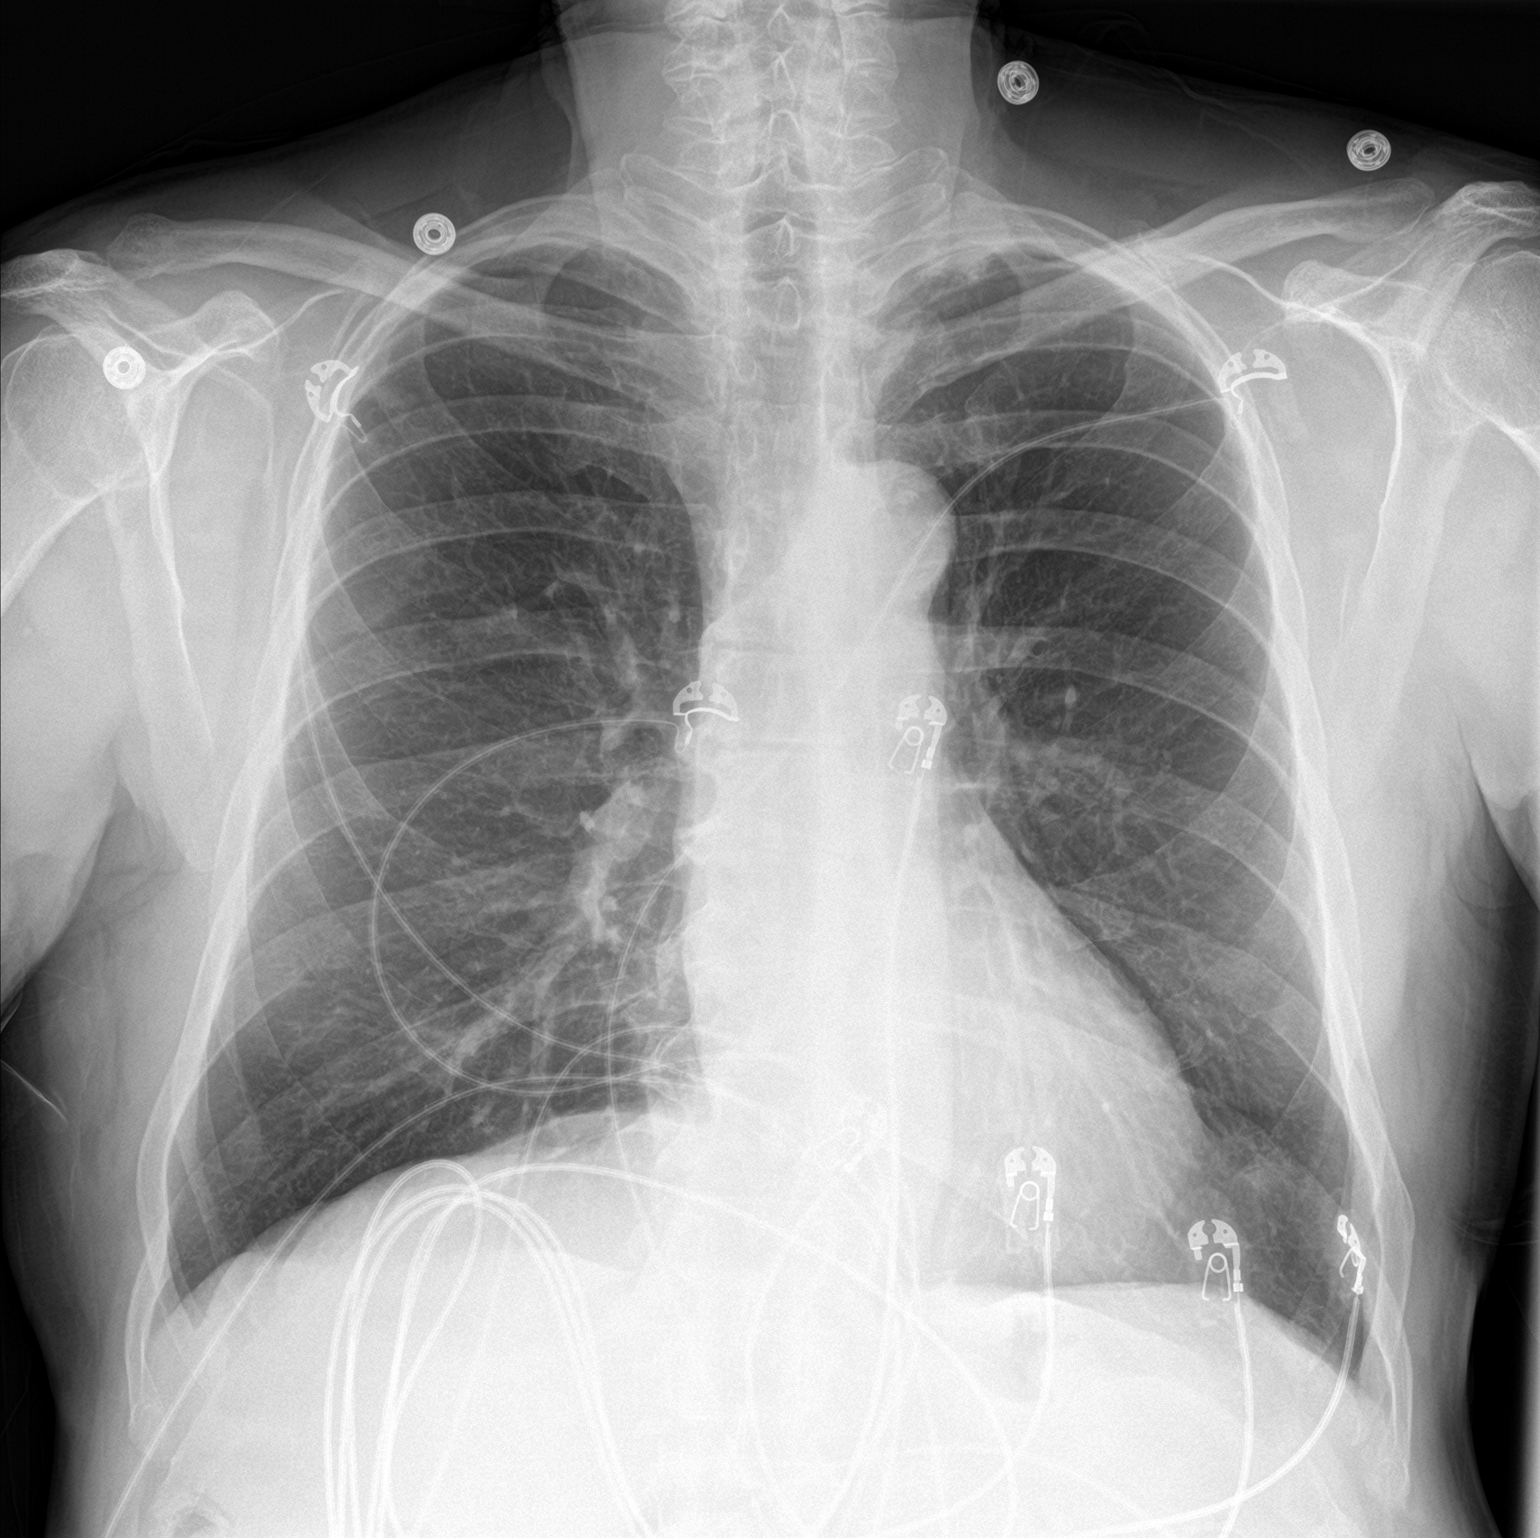

[chest lat]
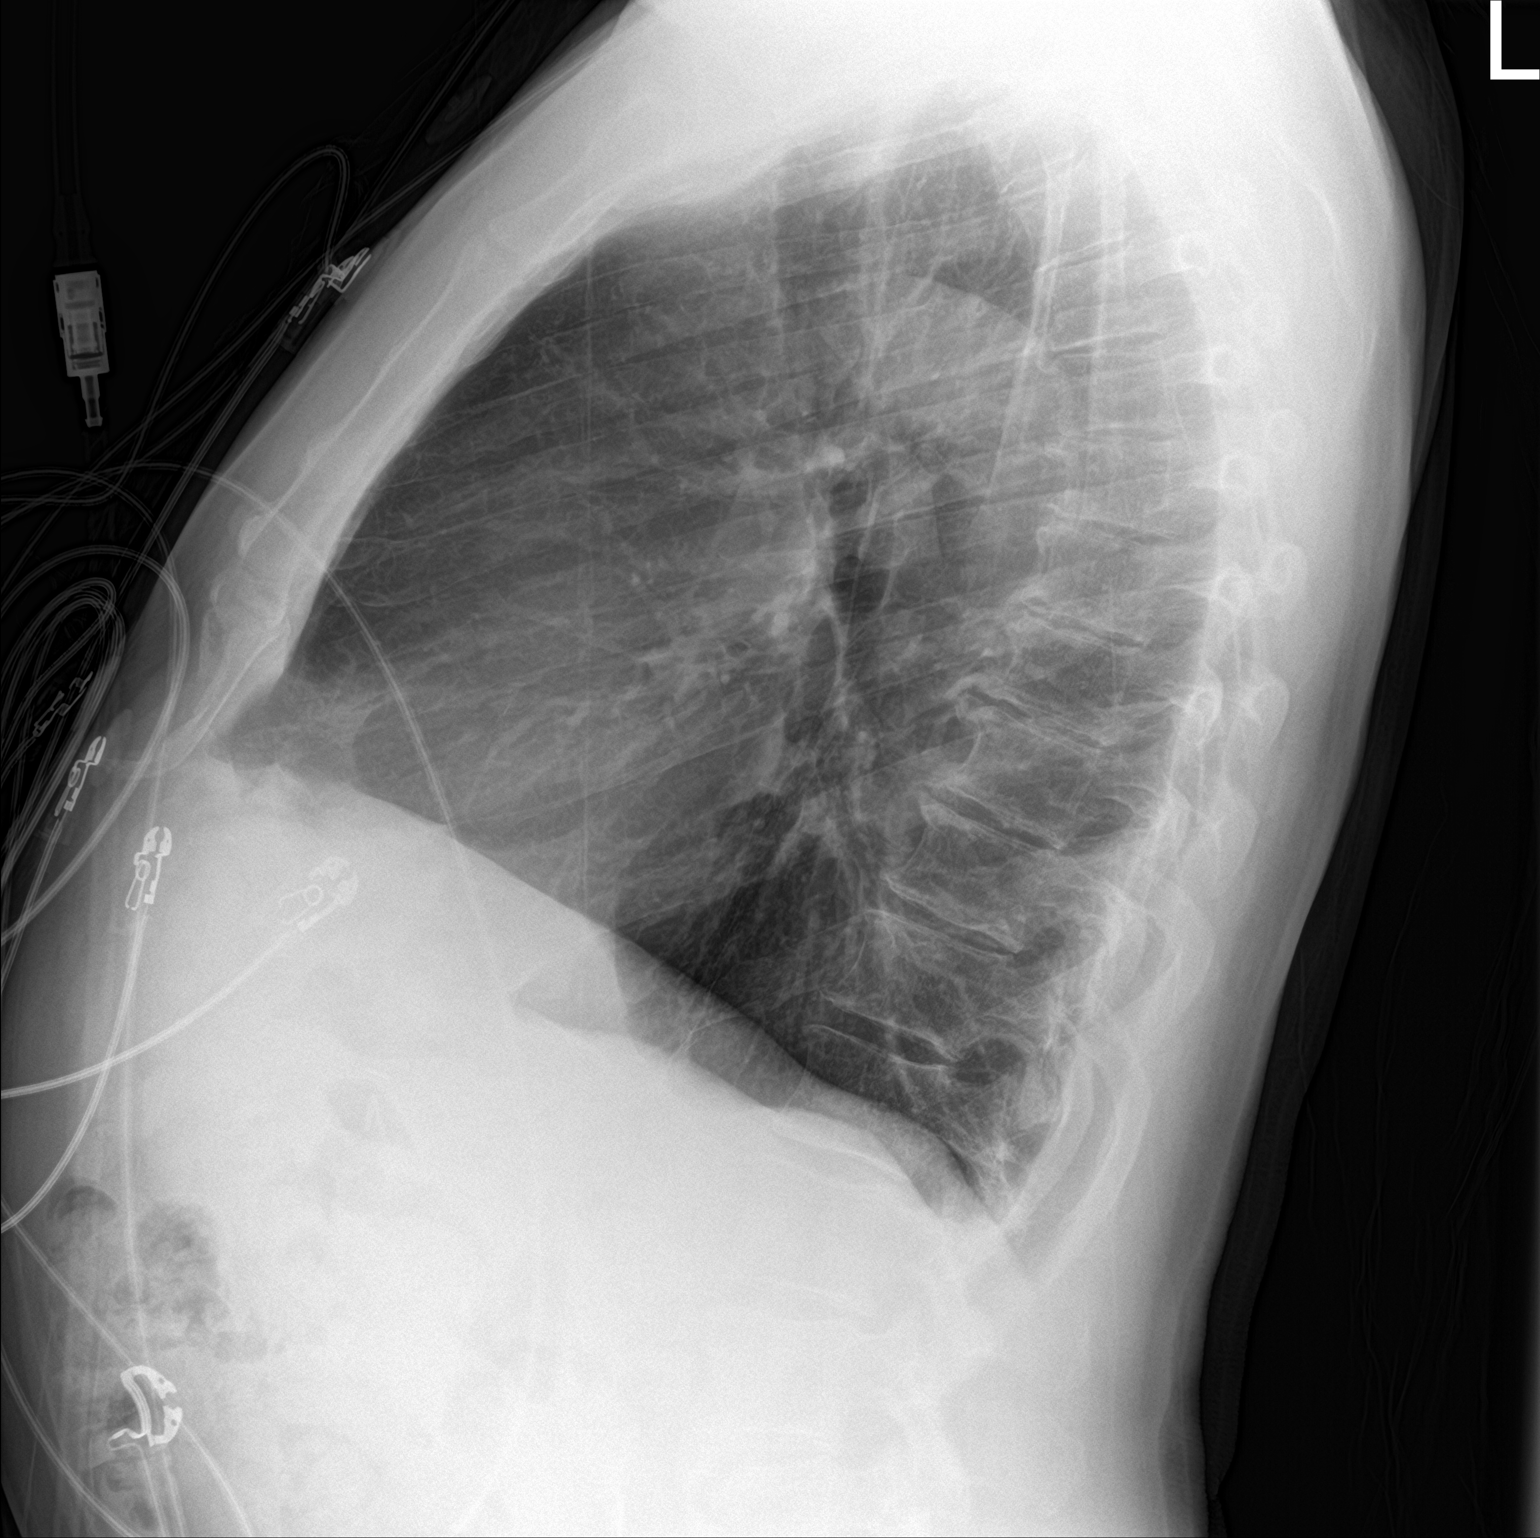

[2 of 2 positions shown; findings below may reference images not displayed]

FINDINGS: Cardiomediastinal silhouette unremarkable. Left apical
pleuroparenchymal opacity likely due to scarring. Lungs otherwise
clear. Bronchovascular markings normal. Pulmonary vascularity
normal. No pleural effusions. No pneumothorax. Degenerative changes
involving the thoracic spine.
IMPRESSION: 1. Left apical pleuroparenchymal opacity likely representing
scarring. In the absence of prior examinations for comparison, a
follow-up chest x-ray in 2-3 months is recommended to confirm
stability.
2.  No acute cardiopulmonary disease.

## 2021-04-29 DIAGNOSIS — I8010 Phlebitis and thrombophlebitis of unspecified femoral vein: Secondary | ICD-10-CM | POA: Insufficient documentation

## 2021-04-29 DIAGNOSIS — I369 Nonrheumatic tricuspid valve disorder, unspecified: Secondary | ICD-10-CM | POA: Insufficient documentation

## 2022-07-11 DIAGNOSIS — R42 Dizziness and giddiness: Secondary | ICD-10-CM | POA: Insufficient documentation

## 2022-07-22 DIAGNOSIS — H532 Diplopia: Secondary | ICD-10-CM | POA: Insufficient documentation

## 2024-01-12 ENCOUNTER — Emergency Department (HOSPITAL_COMMUNITY)

## 2024-01-12 ENCOUNTER — Encounter (HOSPITAL_COMMUNITY): Payer: Self-pay

## 2024-01-12 ENCOUNTER — Emergency Department (HOSPITAL_COMMUNITY)
Admission: EM | Admit: 2024-01-12 | Discharge: 2024-01-12 | Disposition: A | Discharge status: Home / Self Care | Attending: Emergency Medicine | Admitting: Emergency Medicine

## 2024-01-12 DIAGNOSIS — Z7982 Long term (current) use of aspirin: Secondary | ICD-10-CM | POA: Diagnosis not present

## 2024-01-12 DIAGNOSIS — W010XXA Fall on same level from slipping, tripping and stumbling without subsequent striking against object, initial encounter: Secondary | ICD-10-CM | POA: Diagnosis not present

## 2024-01-12 DIAGNOSIS — Z79899 Other long term (current) drug therapy: Secondary | ICD-10-CM | POA: Insufficient documentation

## 2024-01-12 DIAGNOSIS — R0782 Intercostal pain: Secondary | ICD-10-CM | POA: Diagnosis present

## 2024-01-12 DIAGNOSIS — Z8521 Personal history of malignant neoplasm of larynx: Secondary | ICD-10-CM | POA: Diagnosis not present

## 2024-01-12 DIAGNOSIS — S2241XA Multiple fractures of ribs, right side, initial encounter for closed fracture: Secondary | ICD-10-CM | POA: Insufficient documentation

## 2024-01-12 DIAGNOSIS — Y92009 Unspecified place in unspecified non-institutional (private) residence as the place of occurrence of the external cause: Secondary | ICD-10-CM

## 2024-01-12 DIAGNOSIS — E039 Hypothyroidism, unspecified: Secondary | ICD-10-CM | POA: Diagnosis not present

## 2024-01-12 DIAGNOSIS — Y93H2 Activity, gardening and landscaping: Secondary | ICD-10-CM | POA: Insufficient documentation

## 2024-01-12 DIAGNOSIS — K6289 Other specified diseases of anus and rectum: Secondary | ICD-10-CM | POA: Insufficient documentation

## 2024-01-12 DIAGNOSIS — I10 Essential (primary) hypertension: Secondary | ICD-10-CM | POA: Diagnosis not present

## 2024-01-12 HISTORY — DX: Acute embolism and thrombosis of unspecified deep veins of unspecified lower extremity: I82.409

## 2024-01-12 HISTORY — DX: Unspecified convulsions: R56.9

## 2024-01-12 HISTORY — DX: Malignant neoplasm of pharynx, unspecified: C14.0

## 2024-01-12 LAB — CBC WITH DIFFERENTIAL/PLATELET
Abs Immature Granulocytes: 0.03 10*3/uL (ref 0.00–0.07)
Basophils Absolute: 0 10*3/uL (ref 0.0–0.1)
Basophils Relative: 1 %
Eosinophils Absolute: 0.1 10*3/uL (ref 0.0–0.5)
Eosinophils Relative: 1 %
HCT: 46.7 % (ref 39.0–52.0)
Hemoglobin: 14.8 g/dL (ref 13.0–17.0)
Immature Granulocytes: 1 %
Lymphocytes Relative: 16 %
Lymphs Abs: 1 10*3/uL (ref 0.7–4.0)
MCH: 32.7 pg (ref 26.0–34.0)
MCHC: 31.7 g/dL (ref 30.0–36.0)
MCV: 103.1 fL — ABNORMAL HIGH (ref 80.0–100.0)
Monocytes Absolute: 0.4 10*3/uL (ref 0.1–1.0)
Monocytes Relative: 7 %
Neutro Abs: 4.7 10*3/uL (ref 1.7–7.7)
Neutrophils Relative %: 74 %
Platelets: 173 10*3/uL (ref 150–400)
RBC: 4.53 MIL/uL (ref 4.22–5.81)
RDW: 13.2 % (ref 11.5–15.5)
WBC: 6.2 10*3/uL (ref 4.0–10.5)
nRBC: 0 % (ref 0.0–0.2)

## 2024-01-12 LAB — URINALYSIS, ROUTINE W REFLEX MICROSCOPIC
Bacteria, UA: NONE SEEN
Bilirubin Urine: NEGATIVE
Glucose, UA: NEGATIVE mg/dL
Hgb urine dipstick: NEGATIVE
Ketones, ur: NEGATIVE mg/dL
Leukocytes,Ua: NEGATIVE
Nitrite: NEGATIVE
Protein, ur: NEGATIVE mg/dL
Specific Gravity, Urine: 1.04 — ABNORMAL HIGH (ref 1.005–1.030)
pH: 6 (ref 5.0–8.0)

## 2024-01-12 LAB — COMPREHENSIVE METABOLIC PANEL WITH GFR
ALT: 18 U/L (ref 0–44)
AST: 21 U/L (ref 15–41)
Albumin: 3.9 g/dL (ref 3.5–5.0)
Alkaline Phosphatase: 87 U/L (ref 38–126)
Anion gap: 8 (ref 5–15)
BUN: 20 mg/dL (ref 8–23)
CO2: 22 mmol/L (ref 22–32)
Calcium: 8.9 mg/dL (ref 8.9–10.3)
Chloride: 108 mmol/L (ref 98–111)
Creatinine, Ser: 1.06 mg/dL (ref 0.61–1.24)
GFR, Estimated: >60 mL/min (ref >60)
Glucose, Bld: 98 mg/dL (ref 70–99)
Potassium: 3.9 mmol/L (ref 3.5–5.1)
Sodium: 138 mmol/L (ref 135–145)
Total Bilirubin: 0.5 mg/dL (ref 0.0–1.2)
Total Protein: 7.5 g/dL (ref 6.5–8.1)

## 2024-01-12 MED ORDER — MORPHINE SULFATE (PF) 4 MG/ML IV SOLN
4.0000 mg | Freq: Once | INTRAVENOUS | Status: AC
  Administered 2024-01-12: 4 mg via INTRAVENOUS
  Filled 2024-01-12: qty 1

## 2024-01-12 MED ORDER — SODIUM CHLORIDE (PF) 0.9 % IJ SOLN
INTRAMUSCULAR | Status: AC
  Filled 2024-01-12: qty 50

## 2024-01-12 MED ORDER — OXYCODONE HCL 5 MG PO TABS: 5.0000 mg | ORAL_TABLET | ORAL | 0 refills | Status: AC | PRN

## 2024-01-12 MED ORDER — ONDANSETRON HCL 4 MG/2ML IJ SOLN: 4.0000 mg | Freq: Four times a day (QID) | INTRAMUSCULAR | Status: DC | PRN

## 2024-01-12 MED ORDER — IOHEXOL 300 MG/ML  SOLN
100.0000 mL | Freq: Once | INTRAMUSCULAR | Status: AC | PRN
  Administered 2024-01-12: 100 mL via INTRAVENOUS

## 2024-01-12 NOTE — ED Notes (Signed)
 Pt aware urine sample needed. Urinal left at bedside.

## 2024-01-12 NOTE — Discharge Instructions (Addendum)
 Thank you for coming to [facility] Emergency Department. You were seen for fall, right rib pain. We did an exam, labs, and imaging, and these showed right 6th and 9th rib fractures. You can take 1000 g Tylenol  every 8 hours.  Please also take oxycodone 5 mg every 4-6 hours as needed for severe pain.  Please do not drive while taking this medication and be aware that it can cause drowsiness and increased risk for falls.  Please use the incentive spirometer once every hour while awake.  Please follow-up with your primary care physician within 1 week.  You can find a primary care physician by going onto the [facility] website and searching for first available or by a specific location.  We also found 2 masses in the rectum that could be consistent with rectal carcinoma or cancer.  Please follow up with Dr. Veronda Goody with [facility] within 1-2 weeks for a colonoscopy/sigmoidoscopy. You can call his office in the next couple of days if they have not reached out to you.  Do not hesitate to return to the ED or call 911 if you experience: -Worsening symptoms -Severe chest pain, shortness of breath -Severe rectal bleeding -Lightheadedness, passing out -Fevers/chills -Anything else that concerns you

## 2024-01-12 NOTE — ED Triage Notes (Signed)
 Pt c/o R rib cage pain after a trip and fall x2 days ago.  Pain score 8/10.  Pt is on Xarelto , but denies hitting head.  No bruising noted.

## 2024-01-12 NOTE — ED Provider Notes (Signed)
  EMERGENCY DEPARTMENT AT [facility] Provider Note   CSN: [id number] Arrival date & time: 01/12/24  0901     History  Chief Complaint  Patient presents with   Fall   Rib Injury    [name] is a 78 y.o. 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 States he tripped over vines while working in his yard. Pain score 8/10. Worse with deep breathing, standing, moving around. Pt is on Xarelto , but denies hitting head, losing consciousness, or neck pain. Fell onto right side and right arm and has abrasions/skin tears to right elbow but no arm/shoulder pain. No f/c, urinary sxs, extremity pain. Otherwise was in his NSOH.   Past Medical History:  Diagnosis Date   DVT (deep venous thrombosis) (HCC)    Hyperlipidemia    Hypertension    Seizures (HCC)    Throat cancer (HCC)        Home Medications Prior to Admission medications   Medication Sig Start Date End Date Taking? Authorizing Provider  oxyCODONE (ROXICODONE) 5 MG immediate release tablet Take 1 tablet (5 mg total) by mouth every 4 (four) hours as needed for up to 5 days for severe pain (pain score 7-10). 01/12/24 01/17/24 Yes Merdis Stalling, MD  amLODipine -benazepril  (LOTREL) 5-20 MG capsule Take 1 capsule by mouth daily.    [provider]  aspirin  81 MG chewable tablet Chew 81 mg by mouth daily.    [provider]  levothyroxine  (SYNTHROID , LEVOTHROID) 112 MCG tablet Take 112 mcg by mouth daily before breakfast.    [provider]  omeprazole (PRILOSEC) 20 MG capsule Take 20 mg by mouth daily.    [provider]  pravastatin  (PRAVACHOL ) 40 MG tablet Take 40 mg by mouth daily.    [provider]  rivaroxaban  (XARELTO ) 20 MG TABS tablet Take 20 mg by mouth daily.    [provider]  sertraline  (ZOLOFT ) 50 MG tablet  Take 50 mg by mouth daily.    [provider]  solifenacin (VESICARE) 5 MG tablet Take 5 mg by mouth daily.    [provider]  topiramate  (TOPAMAX ) 50 MG tablet Take 50 mg by mouth daily.    [provider]      Allergies    Nitrates, organic    Review of Systems   Review of Systems A 10 point review of systems was performed and is negative unless otherwise reported in HPI.  Physical Exam Updated Vital Signs BP (!) 142/84   Pulse (!) 44   Temp 97.9 F (36.6 C) (Oral)   Resp 15   Ht 6' (1.829 m)   Wt 81.6 kg   SpO2 100%   BMI 24.41 kg/m  Physical Exam  PRIMARY SURVEY  Airway Airway intact  Breathing Bilateral breath sounds  Circulation Carotid/femoral pulses 2+ intact bilaterally  GCS E =  4 V =  5 M =  6 Total = 15  Environment All clothes removed      SECONDARY SURVEY  Gen: -NAD  HEENT: -Head: NCAT. Scalp is clear of lacerations or wounds. Skull is clear of deformities or depressions -Forehead: Normal -Midface: Stable -Eyes: No visible injury to eyelids or eye, PERRL, EOMI -Nose: No gross deformities -Mouth: No injuries to lips, tongue or teeth. No trismus or malposition -Ears: No  auricular hematoma -Neck: Trachea is midline, no distended neck veins  Chest: -+TTP to R midaxillary ribs 6-10 with splinting breaths  -Mild bruising noted to R lower anterior ribs/RUQ region -No deformities or crepitus to clavicles or chest -Normal chest expansion -Normal heart sounds, S1/S2 normal, no m/r/g -No wheezes, rales, rhonchi  Abdomen: -No tenderness, bruising or penetrating injury  Pelvis: -Pelvis is stable and non-tender  Extremities: Right Upper Extremity: -+ point tenderness with mild skin abrasions to r lateral elbow without deformity or other signs of injury -Radial pulse intact RUE, cap refill good -Normal sensation -Normal ROM, good strength Left Upper Extremity: -No point tenderness, deformity or other signs of injury -Radial pulse  intact LUE, cap refill good -Normal sensation -Normal ROM, good strength Right Lower Extremity: -No point tenderness, deformity or other signs of injury -DP intact RLE -Normal sensation -Normal ROM, good strength Left Lower Extremity: -No point tenderness, deformity or other signs of injury -DP intact LLE -Normal sensation -Normal ROM, good strength  Back/Spine: -No midline C, T or L spine tenderness or step-offs  Other: N/A     ED Results / Procedures / Treatments   Labs (all labs ordered are listed, but only abnormal results are displayed) Labs Reviewed  CBC WITH DIFFERENTIAL/PLATELET - Abnormal; Notable for the following components:      Result Value   MCV 103.1 (*)    All other components within normal limits  URINALYSIS, ROUTINE W REFLEX MICROSCOPIC - Abnormal; Notable for the following components:   Specific Gravity, Urine 1.040 (*)    All other components within normal limits  COMPREHENSIVE METABOLIC PANEL WITH GFR    EKG EKG Interpretation Date/Time:  Wednesday January 12 2024 09:19:26 EDT Ventricular Rate:  58 PR Interval:  213 QRS Duration:  111 QT Interval:  440 QTC Calculation: 433 R Axis:   85  Text Interpretation: Sinus or ectopic atrial rhythm Borderline prolonged PR interval Borderline right axis deviation Confirmed by Annita Kindle [phone]) on 01/12/2024 9:32:03 AM  Radiology CT CHEST ABDOMEN PELVIS W CONTRAST Result Date: 01/12/2024 CLINICAL DATA:  Polytrauma, blunt. Right rib cage pain. Trip and fall 2 days ago. * Tracking Code: BO * EXAM: CT CHEST, ABDOMEN, AND PELVIS WITH CONTRAST TECHNIQUE: Multidetector CT imaging of the chest, abdomen and pelvis was performed following the standard protocol during bolus administration of intravenous contrast. RADIATION DOSE REDUCTION: This exam was performed according to the departmental dose-optimization program which includes automated exposure control, adjustment of the mA and/or kV according to patient size and/or  use of iterative reconstruction technique. CONTRAST:  100mL OMNIPAQUE IOHEXOL 300 MG/ML  SOLN COMPARISON:  None Available. FINDINGS: CT CHEST FINDINGS Cardiovascular: Normal cardiac size. No pericardial effusion. No aortic aneurysm. There are coronary artery calcifications, in keeping with coronary artery disease. There are also mild-to-moderate peripheral atherosclerotic vascular calcifications of thoracic aorta and its major branches. Mediastinum/Nodes: Small/atrophic thyroid gland without discrete nodules within the limitations of this exam. No solid / cystic mediastinal masses. The esophagus is nondistended precluding optimal assessment. No axillary, mediastinal or hilar lymphadenopathy by size criteria. Lungs/Pleura: The central tracheo-bronchial tree is patent. There are patchy areas of linear, plate-like atelectasis and/or scarring throughout bilateral lungs. No mass or consolidation. No pleural effusion or pneumothorax. No suspicious lung nodules. Musculoskeletal: The visualized soft tissues of the chest wall are grossly unremarkable. No suspicious osseous lesions. There are mild to moderate multilevel degenerative changes in the visualized spine. There are mildly displaced fractures of anterolateral right sixth and ninth ribs.  CT ABDOMEN PELVIS FINDINGS Hepatobiliary: The liver is normal in size. Non-cirrhotic configuration. No suspicious mass. No intrahepatic or extrahepatic bile duct dilation. Gallbladder is surgically absent. Pancreas: No focal mass. Mild prominence of main pancreatic duct measuring up to 6-7 mm in the pancreatic head region however, no obstructing mass. No peripancreatic fat stranding. Spleen: Within normal limits. No focal lesion. Adrenals/Urinary Tract: Adrenal glands are unremarkable. No suspicious renal mass. There are several simple cysts in bilateral kidneys with largest arising from the right kidney interpolar region, laterally measuring 1.4 x 1.6 cm. No hydroureteronephrosis on  either side. There are at least 4, 1-3 mm sized nonobstructing calculi in the right kidney and at least 2 nonobstructing calculi in the left kidney with largest measuring up to 6 mm in the interpolar region. No ureterolithiasis on either side. Unremarkable urinary bladder. Stomach/Bowel: No disproportionate dilation of the small or large bowel loops. There are 2 separate irregular hyperattenuating lobulated masses along the upper rectum on the right side and lower rectum also on the right side. There is no resulting proximal bowel dilation however, findings are highly concerning for rectal carcinoma. The largest mass is along the right side of the upper rectum and measures up to 2.5 x 3.6 x 5.2 cm and is inseparable from the right rectal wall. However, the larger component is outside the wall. Clinical correlation, sigmoidoscopy and further evaluation with contrast-enhanced MRI pelvis as per rectal mass protocol is recommended. Vascular/Lymphatic: No ascites or pneumoperitoneum. No abdominal or pelvic lymphadenopathy, by size criteria. No aneurysmal dilation of the major abdominal arteries. There are moderate peripheral atherosclerotic vascular calcifications of the aorta and its major branches. Reproductive: Normal size prostate. Symmetric seminal vesicles. Other: There is tiny fat containing umbilical hernia and tiny fat containing right inguinal hernia. There is small fat containing left inguinal scrotal. The soft tissues and abdominal wall are otherwise unremarkable. Musculoskeletal: No suspicious osseous lesions. There are mild - moderate multilevel degenerative changes in the visualized spine. There is grade 1 anterolisthesis of L4 over L5. IMPRESSION: 1. Mildly displaced fractures of anterolateral right sixth and ninth ribs. No pneumothorax, pleural effusion or lung contusion. No other traumatic injury seen within the chest, abdomen or pelvis. 2. There are 2 separate irregular hyperattenuating lobulated  masses along the upper rectum on the right side and lower rectum also on the right side. There is no resulting proximal bowel dilation however, findings are highly concerning for rectal carcinoma. Clinical correlation, sigmoidoscopy and further evaluation with contrast-enhanced MRI pelvis as per rectal mass protocol is recommended. 3. No discrete metastatic disease identified within the chest, abdomen or pelvis. 4. Multiple bilateral nonobstructing nephrolithiasis. No ureterolithiasis or obstructive uropathy on either side. 5. Multiple other nonacute observations, as described above. Aortic Atherosclerosis (ICD10-I70.0).  Which Electronically Signed   By: Beula Brunswick M.D.   On: 01/12/2024 11:44    Procedures Procedures    Medications Ordered in ED Medications  ondansetron (ZOFRAN) injection 4 mg (has no administration in time range)  morphine (PF) 4 MG/ML injection 4 mg (4 mg Intravenous Given 01/12/24 1002)  iohexol (OMNIPAQUE) 300 MG/ML solution 100 mL (100 mLs Intravenous Contrast Given 01/12/24 1106)    ED Course/ Medical Decision Making/ A&P                          Medical Decision Making Amount and/or Complexity of Data Reviewed Labs: ordered. Decision-making details documented in ED Course. Radiology: ordered. Decision-making details documented in  ED Course.  Risk Prescription drug management.    This patient presents to the ED for concern of fall, rib pain, this involves an extensive number of treatment options, and is a complaint that carries with it a high risk of complications and morbidity.  I considered the following differential and admission for this acute, potentially life threatening condition.   MDM:    DDX for trauma includes but is not limited to:  -Head Injury such as skull fx or ICH - no head trauma or LOC -Chest Injury and Abdominal Injury - including hemo/pneumothorax, cardiac, abdominal solid and hollow organ injury - will get CT C/A/P. Likely w/ rib fx's  given sxs -No neck pain to indicate Vertebral injury   Clinical Course as of 01/12/24 1610  Wed Jan 12, 2024  1011 CBC with Differential(!) Unremarkable in the context of this patient's presentation  [HN]  1121 Comprehensive metabolic panel wnl [HN]  1202 CT CHEST ABDOMEN PELVIS W CONTRAST 1. Mildly displaced fractures of anterolateral right sixth and ninth ribs. No pneumothorax, pleural effusion or lung contusion. No other traumatic injury seen within the chest, abdomen or pelvis. 2. There are 2 separate irregular hyperattenuating lobulated masses along the upper rectum on the right side and lower rectum also on the right side. There is no resulting proximal bowel dilation however, findings are highly concerning for rectal carcinoma. Clinical correlation, sigmoidoscopy and further evaluation with contrast-enhanced MRI pelvis as per rectal mass protocol is recommended. 3. No discrete metastatic disease identified within the chest, abdomen or pelvis. 4. Multiple bilateral nonobstructing nephrolithiasis. No ureterolithiasis or obstructive uropathy on either side. 5. Multiple other nonacute observations, as described above.  Aortic Atherosclerosis (ICD10-I70.0).    [HN]  1234 Patient and his wife are informed of findings of CT scan. He would like to manage at home for Rib fx. 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 Patient is new to the area. They recently moved from [facility] and are not established with GI here. He has had colonoscopy within the last year. Also had obtained regular CT/PET scans d/t h/o cancer and this was never noted. Denies any hematochezia/melena.  [HN]  1235 Ordered incentive spirometry. Consulted to GI to expedite follow-up. [HN]  1318 Urinalysis, Routine w reflex microscopic -Urine, Clean Catch(!) Unremarkable in the context of this patient's presentation  [HN]  1329 Re-paging GI [HN]    Clinical Course User Index [HN] Merdis Stalling, MD   Discussed  with Dr. Veronda Goody with [facility] who will agree to see the patient in follow-up.  Patient is also noted to have mild bradycardia.  He is asymptomatic, does not feel lightheaded or have any chest pain.  Patient can follow-up outpatient for this as well.  Discussed extensively with patient about pain control for his rib fractures and every hour incentive spirometry while awake.  Discussed care while taking opiates and increased risk for drowsiness and falls, advised not to drive.  Advised to follow-up outpatient with GI for colonoscopy/sigmoidoscopy and further workup for potential rectal carcinoma.  Patient and his wife report understanding.  All questions answered to patient's satisfaction.   Labs: I Ordered, and personally interpreted labs.  The pertinent results include:  those listed above  Imaging Studies ordered: I ordered imaging studies including CT C/A/P I independently visualized and interpreted imaging. I agree with the radiologist interpretation  Additional history obtained from chart review, wife at bedside.   Cardiac Monitoring: The patient was maintained on a cardiac monitor.  I personally viewed and interpreted the cardiac monitored which showed an underlying rhythm of: sinus bradycardia  Reevaluation: After the interventions noted above, I reevaluated the patient and found that they have :improved  Social Determinants of Health: Lives independently  Disposition:  DC  Co morbidities that complicate the patient evaluation  Past Medical History:  Diagnosis Date   DVT (deep venous thrombosis) (HCC)    Hyperlipidemia    Hypertension    Seizures (HCC)    Throat cancer (HCC)      Medicines Meds ordered this encounter  Medications   morphine (PF) 4 MG/ML injection 4 mg   ondansetron (ZOFRAN) injection 4 mg   iohexol (OMNIPAQUE) 300 MG/ML solution 100 mL   oxyCODONE (ROXICODONE) 5 MG immediate release tablet    Sig: Take 1 tablet (5 mg total) by mouth every 4 (four)  hours as needed for up to 5 days for severe pain (pain score 7-10).    Dispense:  18 tablet    Refill:  0    I have reviewed the patients home medicines and have made adjustments as needed  Problem List / ED Course: Problem List Items Addressed This Visit   None Visit Diagnoses       Closed fracture of multiple ribs of right side, initial encounter    -  Primary     Fall in home, initial encounter         Rectal mass                       This note was created using dictation software, which may contain spelling or grammatical errors.    Merdis Stalling, MD 01/12/24 [phone]

## 2024-02-14 ENCOUNTER — Other Ambulatory Visit: Payer: Self-pay | Admitting: Gastroenterology

## 2024-02-14 DIAGNOSIS — K629 Disease of anus and rectum, unspecified: Secondary | ICD-10-CM

## 2024-02-14 DIAGNOSIS — R933 Abnormal findings on diagnostic imaging of other parts of digestive tract: Secondary | ICD-10-CM

## 2024-03-02 ENCOUNTER — Ambulatory Visit
Admission: RE | Admit: 2024-03-02 | Discharge: 2024-03-02 | Disposition: A | Discharge status: Home / Self Care | Source: Ambulatory Visit | Attending: Gastroenterology

## 2024-03-02 DIAGNOSIS — K629 Disease of anus and rectum, unspecified: Secondary | ICD-10-CM

## 2024-03-02 DIAGNOSIS — R933 Abnormal findings on diagnostic imaging of other parts of digestive tract: Secondary | ICD-10-CM

## 2024-03-02 MED ORDER — GADOPICLENOL 0.5 MMOL/ML IV SOLN
8.0000 mL | Freq: Once | INTRAVENOUS | Status: AC | PRN
  Administered 2024-03-02: 8 mL via INTRAVENOUS

## 2024-03-07 ENCOUNTER — Inpatient Hospital Stay: Attending: Hematology | Admitting: Hematology

## 2024-03-07 ENCOUNTER — Encounter: Payer: Self-pay | Admitting: Hematology

## 2024-03-07 ENCOUNTER — Other Ambulatory Visit

## 2024-03-07 VITALS — BP 129/73 | HR 46 | Temp 98.2°F | Resp 16 | Ht 72.0 in | Wt 175.6 lb

## 2024-03-07 DIAGNOSIS — C210 Malignant neoplasm of anus, unspecified: Secondary | ICD-10-CM | POA: Insufficient documentation

## 2024-03-07 DIAGNOSIS — C20 Malignant neoplasm of rectum: Secondary | ICD-10-CM | POA: Insufficient documentation

## 2024-03-07 DIAGNOSIS — Z85818 Personal history of malignant neoplasm of other sites of lip, oral cavity, and pharynx: Secondary | ICD-10-CM | POA: Insufficient documentation

## 2024-03-07 DIAGNOSIS — R197 Diarrhea, unspecified: Secondary | ICD-10-CM | POA: Insufficient documentation

## 2024-03-07 NOTE — Progress Notes (Signed)
 [facility]   Telephone:(336) [phone] [phone]   Clinic New Consult Note   Patient Care Team: Pcp, No as PCP - General [name] [name], RN as Oncology Nurse Navigator 03/07/2024  CHIEF COMPLAINTS/PURPOSE OF CONSULTATION:  Newly diagnosed anal cancer  REFERRING PHYSICIAN: Dr. Elicia   Discussed the use of AI scribe software for clinical note transcription with the patient, who gave verbal consent to proceed.  History of Present Illness Derek Booker is a 78 year old male who presents with frequent bowel movements and loose stools for evaluation of newly diagnosed anal cancer. He was referred by Dr. Mitchell office for evaluation his anal cancer.  He experiences frequent bowel movements, approximately eight times daily, with loose and gaseous stools and urgency. There is no rectal bleeding. He manages symptoms through dietary adjustments.  He has a history of oropharyngeal cancer treated with radiation therapy in 2016. He experienced severe side effects and anxiety during treatment and chemo was not felt to be feasible.   He has hypertension managed with medication and experienced a transient ischemic attack in early 2023. He takes blood thinners for a history of blood clots in his leg.  His family history includes breast cancer, with his mother having died from it in her 74.  He takes medications including blood pressure medication, cholesterol medication, Xarelto , sertraline , Vesicare, Topamax , and gabapentin.     MEDICAL HISTORY:  Past Medical History:  Diagnosis Date   DVT (deep venous thrombosis) (HCC)    Hyperlipidemia    Hypertension    Seizures (HCC)    Throat cancer (HCC)     SURGICAL HISTORY: Past Surgical History:  Procedure Laterality Date   CHOLECYSTECTOMY     ORTHOPEDIC SURGERY      SOCIAL HISTORY: Social History   Socioeconomic History   Marital status: Married    Spouse name: Not on file   Number of  children: Not on file   Years of education: Not on file   Highest education level: Not on file  Occupational History   Not on file  Tobacco Use   Smoking status: Never   Smokeless tobacco: Never  Substance and Sexual Activity   Alcohol use: No   Drug use: No   Sexual activity: Not on file  Other Topics Concern   Not on file  Social History Narrative   Not on file   Social Drivers of Health   Financial Resource Strain: Not on file  Food Insecurity: No Food Insecurity (07/21/2022)   Received from [facility]   Hunger Vital Sign    Within the past 12 months, you worried that your food would run out before you got the money to buy more.: Never true    Within the past 12 months, the food you bought just didn't last and you didn't have money to get more.: Never true  Transportation Needs: No Transportation Needs (07/21/2022)   Received from [facility] - Transportation    Lack of Transportation (Medical): No    Lack of Transportation (Non-Medical): No  Physical Activity: Not on file  Stress: Not on file  Social Connections: Not on file  Intimate Partner Violence: Not on file    FAMILY HISTORY: Family History  Problem Relation Age of Onset   Cancer Mother 93       breast cancer    ALLERGIES:  is allergic to nitrates, organic.  MEDICATIONS:  Current Outpatient Medications  Medication Sig Dispense Refill  amLODipine -benazepril  (LOTREL) 5-20 MG capsule Take 1 capsule by mouth daily.     levothyroxine  (SYNTHROID , LEVOTHROID) 112 MCG tablet Take 112 mcg by mouth daily before breakfast.     omeprazole (PRILOSEC) 20 MG capsule Take 20 mg by mouth daily.     pravastatin  (PRAVACHOL ) 40 MG tablet Take 40 mg by mouth daily.     rivaroxaban  (XARELTO ) 20 MG TABS tablet Take 20 mg by mouth daily.     sertraline  (ZOLOFT ) 50 MG tablet Take 50 mg by mouth daily.     solifenacin (VESICARE) 5 MG tablet Take 5 mg by mouth daily.     topiramate  (TOPAMAX ) 50 MG tablet  Take 50 mg by mouth daily.     No current facility-administered medications for this visit.    REVIEW OF SYSTEMS:   Constitutional: Denies fevers, chills or abnormal night sweats Eyes: Denies blurriness of vision, double vision or watery eyes Ears, nose, mouth, throat, and face: Denies mucositis or sore throat Respiratory: Denies cough, dyspnea or wheezes Cardiovascular: Denies palpitation, chest discomfort or lower extremity swelling Gastrointestinal:  Denies nausea, heartburn or change in bowel habits Skin: Denies abnormal skin rashes Lymphatics: Denies new lymphadenopathy or easy bruising Neurological:Denies numbness, tingling or new weaknesses Behavioral/Psych: Mood is stable, no new changes  All other systems were reviewed with the patient and are negative.  PHYSICAL EXAMINATION: ECOG PERFORMANCE STATUS: 1 - Symptomatic but completely ambulatory  Vitals:   03/07/24 1120  BP: 129/73  Pulse: (!) 46  Resp: 16  Temp: 98.2 F (36.8 C)  SpO2: 100%   Filed Weights   03/07/24 1120  Weight: 175 lb 9.6 oz (79.7 kg)    GENERAL:alert, no distress and comfortable SKIN: skin color, texture, turgor are normal, no rashes or significant lesions EYES: normal, conjunctiva are pink and non-injected, sclera clear OROPHARYNX:no exudate, no erythema and lips, buccal mucosa, and tongue normal  NECK: supple, thyroid normal size, non-tender, without nodularity LYMPH:  no palpable lymphadenopathy in the cervical, axillary or inguinal LUNGS: clear to auscultation and percussion with normal breathing effort HEART: regular rate & rhythm and no murmurs and no lower extremity edema ABDOMEN:abdomen soft, non-tender and normal bowel sounds.  Rectal exam showed a palpable mass on the right side of lower rectum, no blood on glove. Musculoskeletal:no cyanosis of digits and no clubbing  PSYCH: alert & oriented x 3 with fluent speech NEURO: no focal motor/sensory deficits  Physical Exam   LABORATORY  DATA:  I have reviewed the data as listed    Latest Ref Rng & Units 01/12/2024    9:35 AM 12/03/2016    4:57 AM 12/02/2016    7:09 AM  CBC  WBC 4.0 - 10.5 K/uL 6.2  3.9  4.4   Hemoglobin 13.0 - 17.0 g/dL 85.1  86.3  85.1   Hematocrit 39.0 - 52.0 % 46.7  40.5  43.7   Platelets 150 - 400 K/uL 173  168  169       Latest Ref Rng & Units 01/12/2024    9:35 AM 12/03/2016    4:57 AM 12/02/2016    7:09 AM  CMP  Glucose 70 - 99 mg/dL 98  892  885   BUN 8 - 23 mg/dL 20  12  12    Creatinine 0.61 - 1.24 mg/dL 8.93  8.74  8.77   Sodium 135 - 145 mmol/L 138  139  142   Potassium 3.5 - 5.1 mmol/L 3.9  3.8  4.3   Chloride 98 -  111 mmol/L 108  110  109   CO2 22 - 32 mmol/L 22  23  24    Calcium 8.9 - 10.3 mg/dL 8.9  9.2  9.5   Total Protein 6.5 - 8.1 g/dL 7.5     Total Bilirubin 0.0 - 1.2 mg/dL 0.5     Alkaline Phos 38 - 126 U/L 87     AST 15 - 41 U/L 21     ALT 0 - 44 U/L 18        RADIOGRAPHIC STUDIES: I have personally reviewed the radiological images as listed and agreed with the findings in the report. MR PELVIS W WO CONTRAST Result Date: 03/03/2024 CLINICAL DATA:  Rectal mass, further evaluation EXAM: MRI PELVIS WITHOUT AND WITH CONTRAST TECHNIQUE: Multiplanar multisequence MR imaging of the pelvis was performed both before and after administration of intravenous contrast. Ultrasound gel was administered per rectum to optimize tumor evaluation. CONTRAST:  8cc of Vueway  COMPARISON:  Chest CT January 12, 2024. FINDINGS: TUMOR LOCATION Tumor distance from Anal Verge/Skin surface: Smaller mass in the low rectum 14 mm. Larger mass in the upper 9.9 cm Tumor distance to Internal Anal sphincter: Small mass in the low rectum 8 mm. Larger mass in the upper rectum 7.2 cm TUMOR DESCRIPTION Circumferential extent: Smaller mass in the low rectum extends from the 7 o'clock to the 10 o'clock position. Larger mass in the upper greater than 180 degrees from the 5 o'clock to 11 o'clock position. Tumor Size and volume:  Smaller mass in the low rectum measures 2.2 x 2.0 x 2.8 cm on images 18/3 and 7/2. Larger mass measures 3.4 x 2.4 x 4.5 cm on images 9/3 and 8/2. T - CATEGORY Extension through Muscularis Propria: Low rectum T3c, high rectum T3d Shortest Distance of any tumor/node from Mesorectal fascia: Involved Extramural Vascular Invasion/Tumor Thrombus: No definite involvement Invasion of Anterior Peritoneal Reflection: Yes Involvement of Adjacent Organs or Pelvic Sidewall: No Levator Ani Involvement: Yes N - CATEGORY Mesorectal Lymph Nodes >=60mm: N0 Extra-mesorectal Lymphadenopathy: No Other: None. IMPRESSION: 1. 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 2. Rectal adenocarcinoma N stage: N0. 3. Distance from tumor to the internal anal sphincter is 8 mm and 7.2 cm Electronically Signed   By: Reyes Holder M.D.   On: 03/03/2024 15:14    Assessment & Plan Anal squamous cell carcinoma, stage IIA, cT2N0M0 -He presented with frequent bowel movement for 5 to 6 months.  Colonoscopy showed a mass in the rectum, biopsy showed squamous cell carcinoma.  Lymph nodes appear normal on MRI, suggesting a relatively early stage. Surgery would require extensive resection, leading to a permanent ostomy bag. Chemo radiation offers an 70-80% chance of cure, higher than radiation alone  - Order PET scan within the next week or two - Refer to Dr. Dewey for radiation therapy consultation - Plan to start chemo radiation treatment on August 11 - Schedule PICC line placement on August 11 - Enroll in chemo education class - Monitor with PET scan three months post-treatment to assess response  Upper rectal mass -Post endoscopy and images showed a large mass in the upper rectum, biopsy was negative for malignancy -- Conduct repeated endoscopy with Dr. B for biopsy of the upper rectum   History of oropharyngeal squamous cell carcinoma (2016) Treated with radiation in 2016. No chemotherapy was administered due to severe side effects from  radiation and claustrophobia. Recurrence to the anus is considered unusual.  Chronic diarrhea with increased bowel frequency Experiencing chronic diarrhea  with increased bowel frequency for over six months, with approximately eight bowel movements per day. No rectal bleeding reported. Symptoms include loose and gaseous stools with urgency, requiring proximity to a bathroom.  Plan - I reviewed his colonoscopy, CT, MRI and lab results and discussed with patient and his wife -PET in 1-2 weeks  - Urgent referral to radiation oncology Dr. Dewey - I spoke with his GI Dr. Elicia and he agrees to repeat sigmoidoscopy and biopsy of the upper rectal mass - Plan to start concurrent chemoradiation on August 11 with 5-FU and mitomycin - Chemo class and PICC line placement   Orders Placed This Encounter  Procedures   NM PET Image Initial (PI) Skull Base To Thigh    Standing Status:   Future    Expected Date:   03/14/2024    Expiration Date:   03/07/2025    If indicated for the ordered procedure, I authorize the administration of a radiopharmaceutical per Radiology protocol:   Yes    Preferred imaging location?:   [facility]    All questions were answered. The patient knows to call the clinic with any problems, questions or concerns. I spent 50 minutes counseling the patient face to face. The total time spent in the appointment was 60 minutes including review of chart and various tests results, discussions about plan of care and coordination of care plan.     [name], MD 03/07/2024 5:06 PM

## 2024-03-07 NOTE — Progress Notes (Unsigned)
 PATIENT NAVIGATOR PROGRESS NOTE  Name: Derek Booker Date: 03/07/2024 MRN: [id number]  DOB: Aug 27, 1945   Reason for visit:  New patient appointment  Comments:  Patient was seen in office during his initial appointment with Dr. [name].  Patient's wife accompanied him to his appointment.  Patient was given a Journey's binder with information specific to his diagnosis.  Patient was also given my direct contact information and instructed to contact office with any questions or concerns after today's visit.     Time spent counseling/coordinating care: > 60 minutes

## 2024-03-07 NOTE — Progress Notes (Signed)
START ON PATHWAY REGIMEN - Anal Carcinoma     One cycle, concurrent with RT:     Fluorouracil      Mitomycin   **Always confirm dose/schedule in your pharmacy ordering system**  Patient Characteristics: Anal Canal Tumors, Newly Diagnosed - Locoregional Disease (Clinical Staging) Therapeutic Status: Newly Diagnosed - Locoregional Disease (Clinical Staging) AJCC T Category: cT2 AJCC N Category: cN0 AJCC M Category: cM0 AJCC 9 Stage Grouping: IIA Check here if patient was staged using an edition other than AJCC Staging 9th Edition: false Intent of Therapy: Curative Intent, Discussed with Patient

## 2024-03-08 ENCOUNTER — Encounter: Payer: Self-pay | Admitting: Radiation Oncology

## 2024-03-08 ENCOUNTER — Other Ambulatory Visit: Payer: Self-pay

## 2024-03-08 ENCOUNTER — Ambulatory Visit
Admission: RE | Admit: 2024-03-08 | Discharge: 2024-03-08 | Disposition: A | Discharge status: Home / Self Care | Source: Ambulatory Visit | Attending: Radiation Oncology | Admitting: Radiation Oncology

## 2024-03-08 ENCOUNTER — Encounter (HOSPITAL_COMMUNITY)
Admission: RE | Admit: 2024-03-08 | Discharge: 2024-03-08 | Disposition: A | Discharge status: Home / Self Care | Source: Ambulatory Visit | Attending: Hematology | Admitting: Hematology

## 2024-03-08 DIAGNOSIS — Z86718 Personal history of other venous thrombosis and embolism: Secondary | ICD-10-CM | POA: Insufficient documentation

## 2024-03-08 DIAGNOSIS — Z79899 Other long term (current) drug therapy: Secondary | ICD-10-CM | POA: Diagnosis not present

## 2024-03-08 DIAGNOSIS — K5730 Diverticulosis of large intestine without perforation or abscess without bleeding: Secondary | ICD-10-CM | POA: Diagnosis not present

## 2024-03-08 DIAGNOSIS — Z803 Family history of malignant neoplasm of breast: Secondary | ICD-10-CM | POA: Diagnosis not present

## 2024-03-08 DIAGNOSIS — Z79890 Hormone replacement therapy (postmenopausal): Secondary | ICD-10-CM | POA: Diagnosis not present

## 2024-03-08 DIAGNOSIS — C210 Malignant neoplasm of anus, unspecified: Secondary | ICD-10-CM | POA: Diagnosis present

## 2024-03-08 DIAGNOSIS — C109 Malignant neoplasm of oropharynx, unspecified: Secondary | ICD-10-CM

## 2024-03-08 DIAGNOSIS — E785 Hyperlipidemia, unspecified: Secondary | ICD-10-CM | POA: Diagnosis not present

## 2024-03-08 DIAGNOSIS — Z85818 Personal history of malignant neoplasm of other sites of lip, oral cavity, and pharynx: Secondary | ICD-10-CM | POA: Diagnosis not present

## 2024-03-08 DIAGNOSIS — Z7901 Long term (current) use of anticoagulants: Secondary | ICD-10-CM | POA: Diagnosis not present

## 2024-03-08 DIAGNOSIS — Z923 Personal history of irradiation: Secondary | ICD-10-CM | POA: Insufficient documentation

## 2024-03-08 DIAGNOSIS — I10 Essential (primary) hypertension: Secondary | ICD-10-CM | POA: Diagnosis not present

## 2024-03-08 LAB — GLUCOSE, CAPILLARY: Glucose-Capillary: 87 mg/dL (ref 70–99)

## 2024-03-08 MED ORDER — FLUDEOXYGLUCOSE F - 18 (FDG) INJECTION
8.7000 | Freq: Once | INTRAVENOUS | Status: AC
  Administered 2024-03-08: 8.7 via INTRAVENOUS

## 2024-03-08 NOTE — Progress Notes (Signed)
 GI Location of Tumor / Histology:  Derek Booker is a 78 year old male who presents with frequent bowel movements and loose stools for evaluation of newly diagnosed anal cancer. He was referred by Dr. Mitchell office for evaluation his anal cancer.   Derek Booker presented 5-6 months ago with symptoms of: frequent bowel movements  Biopsies of Rectum (if applicable) revealed:    Past/Anticipated interventions by surgeon, if any: yes   Past/Anticipated interventions by medical oncology, if any: yes   Weight changes, if any: no  Bowel/Bladder complaints, if any: yes, frequent episodes of diarrhea.   Nausea / Vomiting, if any: no  Pain issues, if any:  Reports pain to leg and right hip. Rates pain 1-2/10.   Any blood per rectum:   no  SAFETY ISSUES: Prior radiation? Yes 2016  Pacemaker/ICD? no Possible current pregnancy? no Is the patient on methotrexate? no  Current Complaints/Details:    BP (P) 132/78 (BP Location: Left Arm, Patient Position: Sitting)   Pulse (!) (P) 47   Temp (!) (P) 97.3 F (36.3 C) (Temporal)   Resp (P) 18   Ht (P) 6' (1.829 m)   Wt (P) 174 lb (78.9 kg)   BMI (P) 23.60 kg/m

## 2024-03-08 NOTE — Progress Notes (Incomplete)
 Radiation Oncology         [phone] ________________________________  Name: Derek Booker        MRN: [id number]  Date of Service: 03/08/2024 DOB: Aug 05, 1946  RR:[id number], Onita, MD  Lanny Onita, MD     REFERRING PHYSICIAN: Lanny Onita, MD   DIAGNOSIS: The primary encounter diagnosis was Squamous cell carcinoma of oropharynx (HCC). A diagnosis of Anal cancer (HCC) was also pertinent to this visit.   HISTORY OF PRESENT ILLNESS: Derek Booker is a 78 y.o. male seen at the request of Dr. Lanny for a  newly diagnosed anal cancer. The patient has a remote history of Squamous Cell Carcinoma of the oropharynx treated in 2016 in [place]  with radiotherapy. He has lived in [place]  since May 2025.  He has been experiencing symptoms for several months of increased fecal urgency looseness and gassiness.  He was seen by GI and underwent colonoscopy on 02/10/2024 with Dr. Elicia.  Examination endoscopically identified external skin tags on perianal exam, normal TI, a few diverticula in the sigmoid colon as well as 1 large submucosal nodule with superficial inflammation in the proximal rectum biopsies were obtained and distal to this was a large submucosal nodule in the distal rectum with internal hemorrhoids noted during retroflexion.  Biopsies***   PREVIOUS RADIATION THERAPY: Yes   2016: The oropharynx was treated with radiotherapy in [place] . The patient recalls about *** weeks of treatment.   PAST MEDICAL HISTORY:  Past Medical History:  Diagnosis Date   DVT (deep venous thrombosis) (HCC)    Hyperlipidemia    Hypertension    Seizures (HCC)    Throat cancer (HCC)        PAST SURGICAL HISTORY: Past Surgical History:  Procedure Laterality Date   CHOLECYSTECTOMY     ORTHOPEDIC SURGERY       FAMILY HISTORY:  Family History  Problem Relation Age of Onset   Cancer Mother 69       breast cancer     SOCIAL HISTORY:  reports that he has never smoked. He has never used smokeless tobacco. He  reports that he does not drink alcohol and does not use drugs.   ALLERGIES: Nitrates, organic   MEDICATIONS:  Current Outpatient Medications  Medication Sig Dispense Refill   amLODipine -benazepril  (LOTREL) 5-20 MG capsule Take 1 capsule by mouth daily.     levothyroxine  (SYNTHROID , LEVOTHROID) 112 MCG tablet Take 112 mcg by mouth daily before breakfast.     omeprazole (PRILOSEC) 20 MG capsule Take 20 mg by mouth daily.     pravastatin  (PRAVACHOL ) 40 MG tablet Take 40 mg by mouth daily.     rivaroxaban  (XARELTO ) 20 MG TABS tablet Take 20 mg by mouth daily.     sertraline  (ZOLOFT ) 50 MG tablet Take 50 mg by mouth daily.     solifenacin (VESICARE) 5 MG tablet Take 5 mg by mouth daily.     topiramate  (TOPAMAX ) 50 MG tablet Take 50 mg by mouth daily.     No current facility-administered medications for this visit.     REVIEW OF SYSTEMS: On review of systems, the patient reports that *** is doing well overall. *** denies any chest pain, shortness of breath, cough, fevers, chills, night sweats, unintended weight changes. *** denies any bowel or bladder disturbances, and denies abdominal pain, nausea or vomiting. *** denies any new musculoskeletal or joint aches or pains. A complete review of systems is obtained and is otherwise negative.     PHYSICAL EXAM:  Wt  Readings from Last 3 Encounters:  03/07/24 175 lb 9.6 oz (79.7 kg)  01/12/24 180 lb (81.6 kg)  12/03/16 186 lb 1.6 oz (84.4 kg)   Temp Readings from Last 3 Encounters:  03/07/24 98.2 F (36.8 C) (Temporal)  01/12/24 97.9 F (36.6 C) (Oral)  12/03/16 98.7 F (37.1 C) (Oral)   BP Readings from Last 3 Encounters:  03/07/24 129/73  01/12/24 (!) 142/84  12/03/16 131/87   Pulse Readings from Last 3 Encounters:  03/07/24 (!) 46  01/12/24 (!) 44  12/03/16 (!) 52    /10  In general this is a well appearing *** in no acute distress. ***'s alert and oriented x4 and appropriate throughout the examination. Cardiopulmonary  assessment is negative for acute distress and *** exhibits normal effort.     ECOG = ***  0 - Asymptomatic (Fully active, able to carry on all predisease activities without restriction)  1 - Symptomatic but completely ambulatory (Restricted in physically strenuous activity but ambulatory and able to carry out work of a light or sedentary nature. For example, light housework, office work)  2 - Symptomatic, <50% in bed during the day (Ambulatory and capable of all self care but unable to carry out any work activities. Up and about more than 50% of waking hours)  3 - Symptomatic, >50% in bed, but not bedbound (Capable of only limited self-care, confined to bed or chair 50% or more of waking hours)  4 - Bedbound (Completely disabled. Cannot carry on any self-care. Totally confined to bed or chair)  5 - Death   Raylene MM, Creech RH, Tormey DC, et al. [phone]). Toxicity and response criteria of the [facility]. Am. DOROTHA Bridges. Oncol. 5 (6): 649-55    LABORATORY DATA:  Lab Results  Component Value Date   WBC 6.2 01/12/2024   HGB 14.8 01/12/2024   HCT 46.7 01/12/2024   MCV 103.1 (H) 01/12/2024   PLT 173 01/12/2024   Lab Results  Component Value Date   NA 138 01/12/2024   K 3.9 01/12/2024   CL 108 01/12/2024   CO2 22 01/12/2024   Lab Results  Component Value Date   ALT 18 01/12/2024   AST 21 01/12/2024   ALKPHOS 87 01/12/2024   BILITOT 0.5 01/12/2024      RADIOGRAPHY: MR PELVIS W WO CONTRAST Result Date: 03/03/2024 CLINICAL DATA:  Rectal mass, further evaluation EXAM: MRI PELVIS WITHOUT AND WITH CONTRAST TECHNIQUE: Multiplanar multisequence MR imaging of the pelvis was performed both before and after administration of intravenous contrast. Ultrasound gel was administered per rectum to optimize tumor evaluation. CONTRAST:  8cc of Vueway  COMPARISON:  Chest CT January 12, 2024. FINDINGS: TUMOR LOCATION Tumor distance from Anal Verge/Skin surface: Smaller mass in the  low rectum 14 mm. Larger mass in the upper 9.9 cm Tumor distance to Internal Anal sphincter: Small mass in the low rectum 8 mm. Larger mass in the upper rectum 7.2 cm TUMOR DESCRIPTION Circumferential extent: Smaller mass in the low rectum extends from the 7 o'clock to the 10 o'clock position. Larger mass in the upper greater than 180 degrees from the 5 o'clock to 11 o'clock position. Tumor Size and volume: Smaller mass in the low rectum measures 2.2 x 2.0 x 2.8 cm on images 18/3 and 7/2. Larger mass measures 3.4 x 2.4 x 4.5 cm on images 9/3 and 8/2. T - CATEGORY Extension through Muscularis Propria: Low rectum T3c, high rectum T3d Shortest Distance of any tumor/node from Mesorectal fascia:  Involved Extramural Vascular Invasion/Tumor Thrombus: No definite involvement Invasion of Anterior Peritoneal Reflection: Yes Involvement of Adjacent Organs or Pelvic Sidewall: No Levator Ani Involvement: Yes N - CATEGORY Mesorectal Lymph Nodes >=5mm: N0 Extra-mesorectal Lymphadenopathy: No Other: None. IMPRESSION: 1. 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 2. Rectal adenocarcinoma N stage: N0. 3. Distance from tumor to the internal anal sphincter is 8 mm and 7.2 cm Electronically Signed   By: Reyes Holder M.D.   On: 03/03/2024 15:14       IMPRESSION/PLAN: 1.   In a visit lasting *** minutes, greater than 50% of the time was spent face to face discussing the patient's condition, in preparation for the discussion, and coordinating the patient's care.   The above documentation reflects my direct findings during this shared patient visit. Please see the separate note by Dr. Dewey on this date for the remainder of the patient's plan of care.    Donald KYM Husband, [facility]   **Disclaimer: This note was dictated with voice recognition software. Similar sounding words can inadvertently be transcribed and this note may contain transcription errors which may not have been corrected upon publication of note.**

## 2024-03-09 ENCOUNTER — Encounter: Payer: Self-pay | Admitting: Hematology

## 2024-03-09 ENCOUNTER — Other Ambulatory Visit: Payer: Self-pay

## 2024-03-09 ENCOUNTER — Telehealth: Payer: Self-pay | Admitting: Radiation Oncology

## 2024-03-09 ENCOUNTER — Other Ambulatory Visit: Payer: Self-pay | Admitting: Radiation Oncology

## 2024-03-09 DIAGNOSIS — C210 Malignant neoplasm of anus, unspecified: Secondary | ICD-10-CM

## 2024-03-09 NOTE — Progress Notes (Signed)
 The proposed treatment discussed in conference is for discussion purpose only and is not a binding recommendation.  The patients have not been physically examined, or presented with their treatment options.  Therefore, final treatment plans cannot be decided.

## 2024-03-09 NOTE — Telephone Encounter (Signed)
 Sent referral to [facility] Surgery for Dr. White/Dr. Thomas/Dr. Sheldon 03/09/24

## 2024-03-09 NOTE — Progress Notes (Signed)
 I called and left the patient a voice message to review his PET scan results, Dr. Dewey still plans to give him definitive radiation to the pelvis in 6 weeks time beginning on 03/23/2024.  I did let him know that there was some mild uptake in the nasal passage on his PET scan and because of his history of prior oropharyngeal cancer treated over 10 years ago, Dr. Dewey would like us  to have the patient evaluated with risk of that being low given the time since his treatment however.  I encouraged the patient to call back if he has questions or concerns and we can discuss this further.

## 2024-03-10 ENCOUNTER — Ambulatory Visit
Admission: RE | Admit: 2024-03-10 | Discharge: 2024-03-10 | Disposition: A | Discharge status: Home / Self Care | Source: Ambulatory Visit | Attending: Radiation Oncology | Admitting: Radiation Oncology

## 2024-03-10 DIAGNOSIS — C210 Malignant neoplasm of anus, unspecified: Secondary | ICD-10-CM | POA: Insufficient documentation

## 2024-03-13 ENCOUNTER — Other Ambulatory Visit: Payer: Self-pay | Admitting: Radiation Oncology

## 2024-03-13 ENCOUNTER — Telehealth: Payer: Self-pay | Admitting: Radiation Oncology

## 2024-03-13 DIAGNOSIS — C210 Malignant neoplasm of anus, unspecified: Secondary | ICD-10-CM

## 2024-03-13 DIAGNOSIS — C109 Malignant neoplasm of oropharynx, unspecified: Secondary | ICD-10-CM

## 2024-03-13 NOTE — Telephone Encounter (Signed)
 8/4 Outgoing referral faxed to [facility].

## 2024-03-14 ENCOUNTER — Telehealth: Payer: Self-pay | Admitting: Radiation Oncology

## 2024-03-14 ENCOUNTER — Telehealth: Payer: Self-pay | Admitting: Hematology

## 2024-03-14 NOTE — Telephone Encounter (Signed)
 I spoke with Dr. Grayson in pathology from [facility] reading for [facility] specimens. He confirmed the lesion biopsied yesterday reflecting the more proximal lesion in the anal canal is also squamous cell carcinoma. I let the patient know our plan remained the same for chemoRT and that because of covering both lesions Dr. Dewey plans 30 fractions rather than 28. He will get a call from our staff as well since he had questions about treatment times for his radiation.

## 2024-03-15 ENCOUNTER — Other Ambulatory Visit: Payer: Self-pay | Admitting: Hematology

## 2024-03-15 MED ORDER — ONDANSETRON HCL 8 MG PO TABS: 8.0000 mg | ORAL_TABLET | Freq: Three times a day (TID) | ORAL | 2 refills | Status: AC | PRN

## 2024-03-15 MED ORDER — PROCHLORPERAZINE MALEATE 10 MG PO TABS: 10.0000 mg | ORAL_TABLET | Freq: Four times a day (QID) | ORAL | 1 refills | Status: AC | PRN

## 2024-03-16 ENCOUNTER — Inpatient Hospital Stay

## 2024-03-16 DIAGNOSIS — Z79630 Long term (current) use of alkylating agent: Secondary | ICD-10-CM | POA: Insufficient documentation

## 2024-03-16 DIAGNOSIS — Z5111 Encounter for antineoplastic chemotherapy: Secondary | ICD-10-CM | POA: Insufficient documentation

## 2024-03-16 DIAGNOSIS — F419 Anxiety disorder, unspecified: Secondary | ICD-10-CM | POA: Insufficient documentation

## 2024-03-16 DIAGNOSIS — Z79899 Other long term (current) drug therapy: Secondary | ICD-10-CM | POA: Insufficient documentation

## 2024-03-16 DIAGNOSIS — R634 Abnormal weight loss: Secondary | ICD-10-CM | POA: Insufficient documentation

## 2024-03-16 DIAGNOSIS — C218 Malignant neoplasm of overlapping sites of rectum, anus and anal canal: Secondary | ICD-10-CM | POA: Insufficient documentation

## 2024-03-16 DIAGNOSIS — Z86718 Personal history of other venous thrombosis and embolism: Secondary | ICD-10-CM | POA: Insufficient documentation

## 2024-03-16 DIAGNOSIS — G4700 Insomnia, unspecified: Secondary | ICD-10-CM | POA: Insufficient documentation

## 2024-03-16 DIAGNOSIS — K1231 Oral mucositis (ulcerative) due to antineoplastic therapy: Secondary | ICD-10-CM | POA: Insufficient documentation

## 2024-03-16 DIAGNOSIS — D61818 Other pancytopenia: Secondary | ICD-10-CM | POA: Insufficient documentation

## 2024-03-16 DIAGNOSIS — T451X5A Adverse effect of antineoplastic and immunosuppressive drugs, initial encounter: Secondary | ICD-10-CM | POA: Insufficient documentation

## 2024-03-16 DIAGNOSIS — C210 Malignant neoplasm of anus, unspecified: Secondary | ICD-10-CM

## 2024-03-16 DIAGNOSIS — R197 Diarrhea, unspecified: Secondary | ICD-10-CM | POA: Insufficient documentation

## 2024-03-16 DIAGNOSIS — E860 Dehydration: Secondary | ICD-10-CM | POA: Insufficient documentation
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
## 2024-03-17 ENCOUNTER — Encounter: Payer: Self-pay | Admitting: Physical Therapy

## 2024-03-17 ENCOUNTER — Ambulatory Visit: Attending: Radiation Oncology | Admitting: Physical Therapy

## 2024-03-17 ENCOUNTER — Other Ambulatory Visit: Payer: Self-pay

## 2024-03-17 DIAGNOSIS — M6281 Muscle weakness (generalized): Secondary | ICD-10-CM | POA: Insufficient documentation

## 2024-03-17 DIAGNOSIS — R279 Unspecified lack of coordination: Secondary | ICD-10-CM | POA: Diagnosis present

## 2024-03-17 DIAGNOSIS — C210 Malignant neoplasm of anus, unspecified: Secondary | ICD-10-CM | POA: Diagnosis present

## 2024-03-20 ENCOUNTER — Inpatient Hospital Stay

## 2024-03-20 ENCOUNTER — Other Ambulatory Visit: Payer: Self-pay | Admitting: Hematology

## 2024-03-20 ENCOUNTER — Other Ambulatory Visit: Payer: Self-pay

## 2024-03-20 ENCOUNTER — Ambulatory Visit (HOSPITAL_COMMUNITY)
Admission: RE | Admit: 2024-03-20 | Discharge: 2024-03-20 | Disposition: A | Discharge status: Home / Self Care | Source: Ambulatory Visit | Attending: Hematology | Admitting: Hematology

## 2024-03-20 ENCOUNTER — Inpatient Hospital Stay: Admitting: Hematology

## 2024-03-20 VITALS — BP 114/71 | HR 49 | Temp 98.6°F | Resp 14 | Ht 72.0 in | Wt 171.7 lb

## 2024-03-20 DIAGNOSIS — C210 Malignant neoplasm of anus, unspecified: Secondary | ICD-10-CM

## 2024-03-20 DIAGNOSIS — Z452 Encounter for adjustment and management of vascular access device: Secondary | ICD-10-CM | POA: Insufficient documentation

## 2024-03-20 LAB — CBC WITH DIFFERENTIAL (CANCER CENTER ONLY)
Abs Immature Granulocytes: 0.01 K/uL (ref 0.00–0.07)
Basophils Absolute: 0 K/uL (ref 0.0–0.1)
Basophils Relative: 1 %
Eosinophils Absolute: 0.1 K/uL (ref 0.0–0.5)
Eosinophils Relative: 2 %
HCT: 37.6 % — ABNORMAL LOW (ref 39.0–52.0)
Hemoglobin: 12.6 g/dL — ABNORMAL LOW (ref 13.0–17.0)
Immature Granulocytes: 0 %
Lymphocytes Relative: 20 %
Lymphs Abs: 0.8 K/uL (ref 0.7–4.0)
MCH: 32.8 pg (ref 26.0–34.0)
MCHC: 33.5 g/dL (ref 30.0–36.0)
MCV: 97.9 fL (ref 80.0–100.0)
Monocytes Absolute: 0.4 K/uL (ref 0.1–1.0)
Monocytes Relative: 11 %
Neutro Abs: 2.5 K/uL (ref 1.7–7.7)
Neutrophils Relative %: 66 %
Platelet Count: 182 K/uL (ref 150–400)
RBC: 3.84 MIL/uL — ABNORMAL LOW (ref 4.22–5.81)
RDW: 13.8 % (ref 11.5–15.5)
WBC Count: 3.8 K/uL — ABNORMAL LOW (ref 4.0–10.5)
nRBC: 0 % (ref 0.0–0.2)

## 2024-03-20 LAB — RAD ONC ARIA SESSION SUMMARY
Course Elapsed Days: 0
Plan Fractions Treated to Date: 1
Plan Prescribed Dose Per Fraction: 1.8 Gy
Plan Total Fractions Prescribed: 30
Plan Total Prescribed Dose: 54 Gy
Reference Point Dosage Given to Date: 1.8 Gy
Reference Point Session Dosage Given: 1.8 Gy
Session Number: 1

## 2024-03-20 LAB — CMP (CANCER CENTER ONLY)
ALT: 15 U/L (ref 0–44)
AST: 19 U/L (ref 15–41)
Albumin: 3.8 g/dL (ref 3.5–5.0)
Alkaline Phosphatase: 79 U/L (ref 38–126)
Anion gap: 4 — ABNORMAL LOW (ref 5–15)
BUN: 22 mg/dL (ref 8–23)
CO2: 25 mmol/L (ref 22–32)
Calcium: 8.9 mg/dL (ref 8.9–10.3)
Chloride: 109 mmol/L (ref 98–111)
Creatinine: 1.03 mg/dL (ref 0.61–1.24)
GFR, Estimated: >60 mL/min (ref >60)
Glucose, Bld: 84 mg/dL (ref 70–99)
Potassium: 3.9 mmol/L (ref 3.5–5.1)
Sodium: 138 mmol/L (ref 135–145)
Total Bilirubin: 0.6 mg/dL (ref 0.0–1.2)
Total Protein: 6.7 g/dL (ref 6.5–8.1)

## 2024-03-20 MED ORDER — SODIUM CHLORIDE 0.9% FLUSH
10.0000 mL | INTRAVENOUS | Status: DC | PRN
Start: 2024-03-20 — End: 2024-03-20

## 2024-03-20 MED ORDER — SODIUM CHLORIDE 0.9 % IV SOLN
INTRAVENOUS | Status: DC
Start: 2024-03-20 — End: 2024-03-20

## 2024-03-20 MED ORDER — HEPARIN SOD (PORK) LOCK FLUSH 100 UNIT/ML IV SOLN
500.0000 [IU] | Freq: Once | INTRAVENOUS | Status: AC
  Administered 2024-03-20 (×2): 500 [IU] via INTRAVENOUS

## 2024-03-20 MED ORDER — SODIUM CHLORIDE 0.9% FLUSH
10.0000 mL | Freq: Once | INTRAVENOUS | Status: AC
  Administered 2024-03-20 (×2): 10 mL

## 2024-03-20 MED ORDER — LIDOCAINE HCL 1 % IJ SOLN
INTRAMUSCULAR | Status: AC
  Filled 2024-03-20: qty 20

## 2024-03-20 MED ORDER — SODIUM CHLORIDE 0.9 % IV SOLN
1000.0000 mg/m2/d | INTRAVENOUS | Status: DC
  Administered 2024-03-20 (×2): 8500 mg via INTRAVENOUS
  Filled 2024-03-20: qty 170

## 2024-03-20 MED ORDER — MITOMYCIN CHEMO IV INJECTION 20 MG
10.0000 mg/m2 | Freq: Once | INTRAVENOUS | Status: AC
  Administered 2024-03-20 (×2): 20 mg via INTRAVENOUS
  Filled 2024-03-20: qty 40

## 2024-03-20 MED ORDER — HEPARIN SOD (PORK) LOCK FLUSH 100 UNIT/ML IV SOLN
INTRAVENOUS | Status: AC
  Filled 2024-03-20: qty 5

## 2024-03-20 MED ORDER — LIDOCAINE HCL 1 % IJ SOLN
10.0000 mL | Freq: Once | INTRAMUSCULAR | Status: AC
  Administered 2024-03-20 (×2): 5 mL via INTRADERMAL

## 2024-03-20 MED ORDER — PROCHLORPERAZINE MALEATE 10 MG PO TABS
10.0000 mg | ORAL_TABLET | Freq: Once | ORAL | Status: AC
  Administered 2024-03-20 (×2): 10 mg via ORAL
  Filled 2024-03-20: qty 1

## 2024-03-20 NOTE — Procedures (Signed)
 Right basilic vein dual lumen PICC placed without immediate complications. Length 35 cm tip SVC / right atrial junction. Okay to use. Medication used - 1% lidocaine . EBL < 2 ml.   Images saved. Please refer to the imaging section of Epic for full dictation.   [facility] DEL Clara Herbison PA-C 03/20/2024 10:48 AM

## 2024-03-20 NOTE — Patient Instructions (Signed)
 [facility] - A DEPT OF [place]  Discharge Instructions: Thank you for choosing [facility] to provide your oncology and hematology care.   If you have a lab appointment with the Cancer Center, please go directly to the Cancer Center and check in at the registration area.   Wear comfortable clothing and clothing appropriate for easy access to any Portacath or PICC line.   We strive to give you quality time with your provider. You may need to reschedule your appointment if you arrive late (15 or more minutes).  Arriving late affects you and other patients whose appointments are after yours.  Also, if you miss three or more appointments without notifying the office, you may be dismissed from the clinic at the provider's discretion.      For prescription refill requests, have your pharmacy contact our office and allow 72 hours for refills to be completed.    Today you received the following chemotherapy and/or immunotherapy agents: mitomycin  and fluorouracil .    Fluorouracil  Injection What is this medication? FLUOROURACIL  (flure oh YOOR a sil) treats some types of cancer. It works by slowing down the growth of cancer cells. This medicine may be used for other purposes; ask your health care provider or pharmacist if you have questions. COMMON BRAND NAME(S): Adrucil  What should I tell my care team before I take this medication? They need to know if you have any of these conditions: Blood disorders Dihydropyrimidine dehydrogenase (DPD) deficiency Infection, such as chickenpox, cold sores, herpes Kidney disease Liver disease Poor nutrition Recent or ongoing radiation therapy An unusual or allergic reaction to fluorouracil , other medications, foods, dyes, or preservatives If you or your partner are pregnant or trying to get pregnant Breast-feeding How should I use this medication? This medication is injected into a vein. It is administered by  your care team in a hospital or clinic setting. Talk to your care team about the use of this medication in children. Special care may be needed. Overdosage: If you think you have taken too much of this medicine contact a poison control center or emergency room at once. NOTE: This medicine is only for you. Do not share this medicine with others. What if I miss a dose? Keep appointments for follow-up doses. It is important not to miss your dose. Call your care team if you are unable to keep an appointment. What may interact with this medication? Do not take this medication with any of the following: Live virus vaccines This medication may also interact with the following: Medications that treat or prevent blood clots, such as warfarin, enoxaparin, dalteparin This list may not describe all possible interactions. Give your health care provider a list of all the medicines, herbs, non-prescription drugs, or dietary supplements you use. Also tell them if you smoke, drink alcohol, or use illegal drugs. Some items may interact with your medicine. What should I watch for while using this medication? Your condition will be monitored carefully while you are receiving this medication. This medication may make you feel generally unwell. This is not uncommon as chemotherapy can affect healthy cells as well as cancer cells. Report any side effects. Continue your course of treatment even though you feel ill unless your care team tells you to stop. In some cases, you may be given additional medications to help with side effects. Follow all directions for their use. This medication may increase your risk of getting an infection. Call your  care team for advice if you get a fever, chills, sore throat, or other symptoms of a cold or flu. Do not treat yourself. Try to avoid being around people who are sick. This medication may increase your risk to bruise or bleed. Call your care team if you notice any unusual  bleeding. Be careful brushing or flossing your teeth or using a toothpick because you may get an infection or bleed more easily. If you have any dental work done, tell your dentist you are receiving this medication. Avoid taking medications that contain aspirin , acetaminophen , ibuprofen, naproxen, or ketoprofen unless instructed by your care team. These medications may hide a fever. Do not treat diarrhea with over the counter products. Contact your care team if you have diarrhea that lasts more than 2 days or if it is severe and watery. This medication can make you more sensitive to the sun. Keep out of the sun. If you cannot avoid being in the sun, wear protective clothing and sunscreen. Do not use sun lamps, tanning beds, or tanning booths. Talk to your care team if you or your partner wish to become pregnant or think you might be pregnant. This medication can cause serious birth defects if taken during pregnancy and for 3 months after the last dose. A reliable form of contraception is recommended while taking this medication and for 3 months after the last dose. Talk to your care team about effective forms of contraception. Do not father a child while taking this medication and for 3 months after the last dose. Use a condom while having sex during this time period. Do not breastfeed while taking this medication. This medication may cause infertility. Talk to your care team if you are concerned about your fertility. What side effects may I notice from receiving this medication? Side effects that you should report to your care team as soon as possible: Allergic reactions--skin rash, itching, hives, swelling of the face, lips, tongue, or throat Heart attack--pain or tightness in the chest, shoulders, arms, or jaw, nausea, shortness of breath, cold or clammy skin, feeling faint or lightheaded Heart failure--shortness of breath, swelling of the ankles, feet, or hands, sudden weight gain, unusual weakness  or fatigue Heart rhythm changes--fast or irregular heartbeat, dizziness, feeling faint or lightheaded, chest pain, trouble breathing High ammonia level--unusual weakness or fatigue, confusion, loss of appetite, nausea, vomiting, seizures Infection--fever, chills, cough, sore throat, wounds that don't heal, pain or trouble when passing urine, general feeling of discomfort or being unwell Low red blood cell level--unusual weakness or fatigue, dizziness, headache, trouble breathing Pain, tingling, or numbness in the hands or feet, muscle weakness, change in vision, confusion or trouble speaking, loss of balance or coordination, trouble walking, seizures Redness, swelling, and blistering of the skin over hands and feet Severe or prolonged diarrhea Unusual bruising or bleeding Side effects that usually do not require medical attention (report to your care team if they continue or are bothersome): Dry skin Headache Increased tears Nausea Pain, redness, or swelling with sores inside the mouth or throat Sensitivity to light Vomiting This list may not describe all possible side effects. Call your doctor for medical advice about side effects. You may report side effects to FDA at 1-800-FDA-1088. Where should I keep my medication? This medication is given in a hospital or clinic. It will not be stored at home. NOTE: This sheet is a summary. It may not cover all possible information. If you have questions about this medicine, talk to your doctor,  pharmacist, or health care provider.  2024 Elsevier/Gold Standard (2021-12-02 00:00:00) Mitomycin  Injection What is this medication? MITOMYCIN  (mye toe MYE sin) treats stomach cancer and pancreatic cancer. It works by slowing down the growth of cancer cells. This medicine may be used for other purposes; ask your health care provider or pharmacist if you have questions. COMMON BRAND NAME(S): Mutamycin  What should I tell my care team before I take this  medication? They need to know if you have any of these conditions: Bleeding disorders Infection, such as chickenpox, cold sores, herpes Low blood counts, such as low white cells, platelets, red blood cells Kidney disease An unusual or allergic reaction to mitomycin , other medications, foods, dyes, or preservatives Pregnant or trying to get pregnant Breastfeeding How should I use this medication? This medication is injected into a vein. It is given by your care team in a hospital or clinic setting. Talk to your care team about the use of this medication in children. Special care may be needed. Overdosage: If you think you have taken too much of this medicine contact a poison control center or emergency room at once. NOTE: This medicine is only for you. Do not share this medicine with others. What if I miss a dose? Keep appointments for follow-up doses. It is important not to miss your dose. Call your care team if you are unable to keep an appointment. What may interact with this medication? Interactions are not expected. This list may not describe all possible interactions. Give your health care provider a list of all the medicines, herbs, non-prescription drugs, or dietary supplements you use. Also tell them if you smoke, drink alcohol, or use illegal drugs. Some items may interact with your medicine. What should I watch for while using this medication? Your condition will be monitored carefully while you are receiving this medication. You may need blood work while taking this medication. This medication may make you feel generally unwell. This is not uncommon as chemotherapy can affect healthy cells as well as cancer cells. Report any side effects. Continue your course of treatment even though you feel ill unless your care team tells you to stop. This medication may increase your risk of getting an infection. Call your care team for advice if you get a fever, chills, sore throat, or other  symptoms of a cold or flu. Do not treat yourself. Try to avoid being around people who are sick. Avoid taking medications that contain aspirin , acetaminophen , ibuprofen, naproxen, or ketoprofen unless instructed by your care team. These medications may hide a fever. This medication may increase your risk to bruise or bleed. Call your care team if you notice any unusual bleeding. Be careful brushing or flossing your teeth or using a toothpick because you may get an infection or bleed more easily. If you have any dental work done, tell your dentist you are receiving this medication. Talk to your care team if you may be pregnant. Serious birth defects can occur if you take this medication during pregnancy. Contraception is recommended while taking this medication. Your care team can help you find the option that works for you. Do not breastfeed while taking this medication. What side effects may I notice from receiving this medication? Side effects that you should report to your care team as soon as possible: Allergic reactions--skin rash, itching, hives, swelling of the face, lips, tongue, or throat Dry cough, shortness of breath or trouble breathing Infection--fever, chills, cough, sore throat, wounds that don't heal, pain or  trouble when passing urine, general feeling of discomfort or being unwell Kidney injury--decrease in the amount of urine, swelling of the ankles, hands, or feet Low red blood cell level--unusual weakness or fatigue, dizziness, headache, trouble breathing Stomach pain, bloody diarrhea, pale skin, unusual weakness or fatigue, decrease in the amount of urine, which may be signs of hemolytic uremic syndrome Unusual bruising or bleeding Side effects that usually do not require medical attention (report these to your care team if they continue or are bothersome): Diarrhea Hair loss Loss of appetite with weight loss Nausea Pain, redness, or swelling with sores inside the mouth or  throat This list may not describe all possible side effects. Call your doctor for medical advice about side effects. You may report side effects to FDA at 1-800-FDA-1088. Where should I keep my medication? This medication is given in a hospital or clinic. It will not be stored at home. NOTE: This sheet is a summary. It may not cover all possible information. If you have questions about this medicine, talk to your doctor, pharmacist, or health care provider.  2024 Elsevier/Gold Standard (2021-12-18 00:00:00)     To help prevent nausea and vomiting after your treatment, we encourage you to take your nausea medication as directed.  BELOW ARE SYMPTOMS THAT SHOULD BE REPORTED IMMEDIATELY: *FEVER GREATER THAN 100.4 F (38 C) OR HIGHER *CHILLS OR SWEATING *NAUSEA AND VOMITING THAT IS NOT CONTROLLED WITH YOUR NAUSEA MEDICATION *UNUSUAL SHORTNESS OF BREATH *UNUSUAL BRUISING OR BLEEDING *URINARY PROBLEMS (pain or burning when urinating, or frequent urination) *BOWEL PROBLEMS (unusual diarrhea, constipation, pain near the anus) TENDERNESS IN MOUTH AND THROAT WITH OR WITHOUT PRESENCE OF ULCERS (sore throat, sores in mouth, or a toothache) UNUSUAL RASH, SWELLING OR PAIN  UNUSUAL VAGINAL DISCHARGE OR ITCHING   Items with * indicate a potential emergency and should be followed up as soon as possible or go to the Emergency Department if any problems should occur.  Please show the CHEMOTHERAPY ALERT CARD or IMMUNOTHERAPY ALERT CARD at check-in to the Emergency Department and triage nurse.  Should you have questions after your visit or need to cancel or reschedule your appointment, please contact [facility] - A DEPT OF [facility]  Dept: [phone]  and follow the prompts.  Office hours are 8:00 a.m. to 4:30 p.m. Monday - Friday. Please note that voicemails left after 4:00 p.m. may not be returned until the following business day.  We are closed weekends and major  holidays. You have access to a nurse at all times for urgent questions. Please call the main number to the clinic Dept: [phone] and follow the prompts.   For any non-urgent questions, you may also contact your provider using MyChart. We now offer e-Visits for anyone 44 and older to request care online for non-urgent symptoms. For details visit [email or web address].   Also download the MyChart app! Go to the app store, search MyChart, open the app, select [facility], and log in with your MyChart username and password.

## 2024-03-20 NOTE — Progress Notes (Signed)
 PATIENT NAVIGATOR PROGRESS NOTE  Name: Derek Booker Date: 03/20/2024 MRN: [id number]  DOB: October 31, 1945   Reason for visit:  First Day of Chemo/Radiation  Comments:   Met with patient and his wife during his first day of chemo/radiation.  Informed patient and his wife to call the main phone number for the cancer center and follow the prompts to speak with Dr. Demetra nurse.  Patient and wife both verbalized understanding.   Orders were place for PICC line placement for his second round of chemo. Scheduling message sent.    Patient is established with a treatment plan and is actively engaged in care. Nurse Navigator services not currently indicated at this time. Will re-evaluate if needs change or if additional support is requested.   Time spent counseling/coordinating care: 30-45 minutes

## 2024-03-20 NOTE — Assessment & Plan Note (Signed)
 cT3N0M0 - Patient presented with frequent and loose bowel movements -Colonoscopy showed 2 masses in upper and lower rectum, post biopsy showed squamous cell carcinoma.  No nodal metastasis on MRI. - PET scan was negative for nodal or distant metastasis -Plan to start concurrent chemoradiation with 5-FU and mitomycin  on March 20, 2024

## 2024-03-20 NOTE — Progress Notes (Signed)
 [facility]   [phone] [phone]   Clinic Follow up Note   [name], MD as PCP - General (Hematology) [name], [name], RN as Oncology Nurse Navigator  Date of Service:  03/20/2024  CHIEF COMPLAINT: f/u of anal cancer  CURRENT THERAPY:  Concurrent chemoradiation  Oncology History   Anal cancer (HCC) cT3N0M0 - Patient presented with frequent and loose bowel movements -Colonoscopy showed 2 masses in upper and lower rectum, post biopsy showed squamous cell carcinoma.  No nodal metastasis on MRI. 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 PET scan shows localized disease in the rectum without metastasis. Tumor size necessitates extensive radiation. Chemotherapy and radiation are planned to manage the localized disease. Discussed potential side effects: rectal discomfort, diarrhea, bleeding, mucositis, and systemic effects from chemotherapy. Rare severe side effects of 5FU include chest pain and severe mucositis due to slow enzyme metabolism. Emphasized monitoring for fever, chills, and infection signs due to low blood counts. Discussed side effect management and caregiver support importance. - Administer chemotherapy infusion today and provide home pump for 4 days. - Schedule radiation therapy as planned. - Remove PICC line post-chemotherapy cycle and reinsert for next cycle in 4 weeks. - Monitor blood counts weekly; adjust Xarelto  if platelet count drops below 50. - Provide Compazine  and Zofran  for nausea; advise alternating use. - Advise on PICC line management to prevent infection, including keeping it dry.  Plan - Recent rectal biopsy and PET scan images reviewed with patient and his wife - treatment plan reviewed with  patient and his wife again, potential side effects and management reviewed - Lab reviewed, adequate for treatment, will proceed for cycle mitomycin  and 5-FU today, he is scheduled to start radiation today - Lab and follow-up weekly   SUMMARY OF ONCOLOGIC HISTORY: Oncology History  Anal cancer (HCC)  03/07/2024 Initial Diagnosis   Anal cancer (HCC)   03/07/2024 Cancer Staging   Staging form: Anus, AJCC V9 - Clinical: Stage IIIA (cT3, cN0, cM0) - Signed by [name], MD on 03/20/2024   03/20/2024 -  Chemotherapy   Patient is on Treatment Plan : ANUS Mitomycin  D1,28 + 5FU D1-4, 28-31 q32d        Discussed the use of AI scribe software for clinical note transcription with the patient, who gave verbal consent to proceed.  History of Present Illness Derek Booker is a 78 year old male with anal cancer who presents for follow-up. He is accompanied by his wife.  He is undergoing treatment for anal cancer, localized to the rectum as confirmed by recent endoscopy with biopsy. A PET scan shows no metastasis beyond the rectum.  He is on Xarelto , requiring close monitoring of blood counts due to bleeding risk. He takes Compazine  and Zofran  for nausea management.  His wife assists with hygiene to keep the PICC line dry, considering his history of folliculitis.     All other systems were reviewed with the patient and are negative.  MEDICAL HISTORY:  Past Medical History:  Diagnosis Date   DVT (deep venous thrombosis) (HCC)    Hyperlipidemia    Hypertension    Seizures (HCC)    Throat cancer (HCC)     SURGICAL HISTORY: Past Surgical History:  Procedure Laterality Date   CHOLECYSTECTOMY  ORTHOPEDIC SURGERY      I have reviewed the social history and family history with the patient and they are unchanged from previous note.  ALLERGIES:  is allergic to nitrates, organic; chocolate; and sulfites.  MEDICATIONS:  Current Outpatient Medications  Medication Sig Dispense Refill    cyanocobalamin (VITAMIN B12) 1000 MCG tablet Take 1,000 mcg by mouth daily.     donepezil (ARICEPT) 10 MG tablet Take 10 mg by mouth daily.     gabapentin (NEURONTIN) 100 MG capsule Take 100 mg by mouth 3 (three) times daily.     levothyroxine  (SYNTHROID , LEVOTHROID) 112 MCG tablet Take 112 mcg by mouth daily before breakfast.     lisinopril (ZESTRIL) 20 MG tablet Take 20 mg by mouth daily.     mirabegron ER (MYRBETRIQ) 50 MG TB24 tablet Take 50 mg by mouth daily.     omeprazole (PRILOSEC) 20 MG capsule Take 20 mg by mouth daily.     ondansetron  (ZOFRAN ) 8 MG tablet Take 1 tablet (8 mg total) by mouth every 8 (eight) hours as needed for nausea or vomiting. 30 tablet 2   pravastatin  (PRAVACHOL ) 40 MG tablet Take 40 mg by mouth daily.     prochlorperazine  (COMPAZINE ) 10 MG tablet Take 1 tablet (10 mg total) by mouth every 6 (six) hours as needed for nausea or vomiting. 30 tablet 1   rivaroxaban  (XARELTO ) 20 MG TABS tablet Take 20 mg by mouth daily.     sertraline  (ZOLOFT ) 50 MG tablet Take 50 mg by mouth daily.     solifenacin (VESICARE) 5 MG tablet Take 5 mg by mouth daily.     SUMAtriptan  (IMITREX ) 100 MG tablet Take 100 mg by mouth every 2 (two) hours as needed for migraine. May repeat in 2 hours if headache persists or recurs.     topiramate  (TOPAMAX ) 50 MG tablet Take 50 mg by mouth 2 (two) times daily.     No current facility-administered medications for this visit.    PHYSICAL EXAMINATION: ECOG PERFORMANCE STATUS: 1 - Symptomatic but completely ambulatory  Vitals:   03/20/24 1236  BP: 114/71  Pulse: (!) 49  Resp: 14  Temp: 98.6 F (37 C)  SpO2: 100%   Wt Readings from Last 3 Encounters:  03/20/24 171 lb 11.2 oz (77.9 kg)  03/08/24 (P) 174 lb (78.9 kg)  03/07/24 175 lb 9.6 oz (79.7 kg)     GENERAL:alert, no distress and comfortable SKIN: skin color, texture, turgor are normal, no rashes or significant lesions EYES: normal, Conjunctiva are pink and non-injected, sclera  clear Musculoskeletal:no cyanosis of digits and no clubbing  NEURO: alert & oriented x 3 with fluent speech, no focal motor/sensory deficits  Physical Exam    LABORATORY DATA:  I have reviewed the data as listed    Latest Ref Rng & Units 03/20/2024   12:10 PM 01/12/2024    9:35 AM 12/03/2016    4:57 AM  CBC  WBC 4.0 - 10.5 K/uL 3.8  6.2  3.9   Hemoglobin 13.0 - 17.0 g/dL 87.3  85.1  86.3   Hematocrit 39.0 - 52.0 % 37.6  46.7  40.5   Platelets 150 - 400 K/uL 182  173  168         Latest Ref Rng & Units 03/20/2024   12:10 PM 01/12/2024    9:35 AM 12/03/2016    4:57 AM  CMP  Glucose 70 - 99 mg/dL 84  98  892   BUN 8 - 23  mg/dL 22  20  12    Creatinine 0.61 - 1.24 mg/dL 8.96  8.93  8.74   Sodium 135 - 145 mmol/L 138  138  139   Potassium 3.5 - 5.1 mmol/L 3.9  3.9  3.8   Chloride 98 - 111 mmol/L 109  108  110   CO2 22 - 32 mmol/L 25  22  23    Calcium 8.9 - 10.3 mg/dL 8.9  8.9  9.2   Total Protein 6.5 - 8.1 g/dL 6.7  7.5    Total Bilirubin 0.0 - 1.2 mg/dL 0.6  0.5    Alkaline Phos 38 - 126 U/L 79  87    AST 15 - 41 U/L 19  21    ALT 0 - 44 U/L 15  18        RADIOGRAPHIC STUDIES: I have personally reviewed the radiological images as listed and agreed with the findings in the report. IR PICC PLACEMENT RIGHT >5 YRS INC IMG GUIDE Result Date: 03/20/2024 INDICATION: for chemotherapy History of anal cancer. IR consulted for PICC line placement for chemotherapy. EXAM: ULTRASOUND AND FLUOROSCOPIC GUIDED PICC LINE INSERTION MEDICATIONS: 3 mL 1% lidocaine  CONTRAST:  None FLUOROSCOPY TIME:  (8.5 mGy) COMPLICATIONS: None immediate. TECHNIQUE: The procedure, risks, benefits, and alternatives were explained to the patient and informed written consent was obtained. A timeout was performed prior to the initiation of the procedure. The RIGHT upper extremity was prepped with chlorhexidine in a sterile fashion, and a sterile drape was applied covering the operative field. Maximum barrier sterile  technique with sterile gowns and gloves were used for the procedure. A timeout was performed prior to the initiation of the procedure. Local anesthesia was provided with 1% lidocaine . Under direct ultrasound guidance, the basilic vein was accessed with a micropuncture kit after the overlying soft tissues were anesthetized with 1% lidocaine . Real-time ultrasound guidance was utilized for vascular access including the acquisition of a permanent ultrasound image documenting patency of the accessed vessel. A guidewire was advanced to the level of the superior caval-atrial junction for measurement purposes and the PICC line was cut to length. A peel-away sheath was placed and a 35 cm, 5 Fr, dual lumen was inserted to level of the superior caval-atrial junction. A post procedure spot fluoroscopic was obtained. The catheter easily aspirated and flushed and was secured in place with stat lock device. A dressing was applied. The patient tolerated the procedure well without immediate post procedural complication. FINDINGS: After catheter placement, the tip lies within the superior cavoatrial junction. The catheter aspirates and flushes normally and is ready for immediate use. IMPRESSION: Successful ultrasound and fluoroscopic guided placement of a RIGHT basilic vein approach, 35 cm, 5 Fr, dual lumen PICC The tip of the catheter is positioned at the superior cavo-atrial junction. The PICC line is ready for immediate use. Performed and dictated by: Wyatt Pommier, PA-C under supervision of , Electronically Signed   By: Thom Hall M.D.   On: 03/20/2024 10:50      No orders of the defined types were placed in this encounter.  All questions were answered. The patient knows to call the clinic with any problems, questions or concerns. No barriers to learning was detected. The total time spent in the appointment was 30 minutes, including review of chart and various tests results, discussions about plan of care and  coordination of care plan     [name], MD 03/20/2024

## 2024-03-21 ENCOUNTER — Encounter: Payer: Self-pay | Admitting: Gastroenterology

## 2024-03-21 ENCOUNTER — Ambulatory Visit
Admission: RE | Admit: 2024-03-21 | Discharge: 2024-03-21 | Disposition: A | Discharge status: Home / Self Care | Source: Ambulatory Visit | Attending: Radiation Oncology | Admitting: Radiation Oncology

## 2024-03-21 ENCOUNTER — Other Ambulatory Visit: Payer: Self-pay

## 2024-03-21 ENCOUNTER — Telehealth: Payer: Self-pay

## 2024-03-21 DIAGNOSIS — C210 Malignant neoplasm of anus, unspecified: Secondary | ICD-10-CM | POA: Diagnosis not present

## 2024-03-21 LAB — RAD ONC ARIA SESSION SUMMARY
Course Elapsed Days: 1
Plan Fractions Treated to Date: 2
Plan Prescribed Dose Per Fraction: 1.8 Gy
Plan Total Fractions Prescribed: 30
Plan Total Prescribed Dose: 54 Gy
Reference Point Dosage Given to Date: 3.6 Gy
Reference Point Session Dosage Given: 1.8 Gy
Session Number: 2

## 2024-03-21 NOTE — Telephone Encounter (Signed)
-----   Message from Nurse Dawna HERO sent at 03/20/2024  4:52 PM EDT ----- Regarding: Dr Lanny 1st tx f/u call Dr. Lanny - 1st tx f/u call mitomycin

## 2024-03-21 NOTE — Telephone Encounter (Signed)
 Called pt to see how he did with his recent treatment.  He reports doing well & denies any problems.  He knows his appts & how to reach us  if needed.

## 2024-03-22 ENCOUNTER — Other Ambulatory Visit: Payer: Self-pay

## 2024-03-22 ENCOUNTER — Ambulatory Visit
Admission: RE | Admit: 2024-03-22 | Discharge: 2024-03-22 | Disposition: A | Discharge status: Home / Self Care | Source: Ambulatory Visit | Attending: Radiation Oncology | Admitting: Radiation Oncology

## 2024-03-22 DIAGNOSIS — C210 Malignant neoplasm of anus, unspecified: Secondary | ICD-10-CM | POA: Diagnosis not present

## 2024-03-22 LAB — RAD ONC ARIA SESSION SUMMARY
Course Elapsed Days: 2
Plan Fractions Treated to Date: 3
Plan Prescribed Dose Per Fraction: 1.8 Gy
Plan Total Fractions Prescribed: 30
Plan Total Prescribed Dose: 54 Gy
Reference Point Dosage Given to Date: 5.4 Gy
Reference Point Session Dosage Given: 1.8 Gy
Session Number: 3

## 2024-03-23 ENCOUNTER — Other Ambulatory Visit: Payer: Self-pay

## 2024-03-23 ENCOUNTER — Ambulatory Visit
Admission: RE | Admit: 2024-03-23 | Discharge: 2024-03-23 | Disposition: A | Discharge status: Home / Self Care | Source: Ambulatory Visit | Attending: Radiation Oncology | Admitting: Radiation Oncology

## 2024-03-23 DIAGNOSIS — C210 Malignant neoplasm of anus, unspecified: Secondary | ICD-10-CM | POA: Diagnosis not present

## 2024-03-23 LAB — RAD ONC ARIA SESSION SUMMARY
Course Elapsed Days: 3
Plan Fractions Treated to Date: 4
Plan Prescribed Dose Per Fraction: 1.8 Gy
Plan Total Fractions Prescribed: 30
Plan Total Prescribed Dose: 54 Gy
Reference Point Dosage Given to Date: 7.2 Gy
Reference Point Session Dosage Given: 1.8 Gy
Session Number: 4

## 2024-03-24 ENCOUNTER — Ambulatory Visit
Admission: RE | Admit: 2024-03-24 | Discharge: 2024-03-24 | Disposition: A | Discharge status: Home / Self Care | Source: Ambulatory Visit | Attending: Radiation Oncology | Admitting: Radiation Oncology

## 2024-03-24 ENCOUNTER — Other Ambulatory Visit: Payer: Self-pay

## 2024-03-24 ENCOUNTER — Encounter (INDEPENDENT_AMBULATORY_CARE_PROVIDER_SITE_OTHER): Payer: Self-pay | Admitting: Otolaryngology

## 2024-03-24 ENCOUNTER — Inpatient Hospital Stay

## 2024-03-24 ENCOUNTER — Ambulatory Visit (INDEPENDENT_AMBULATORY_CARE_PROVIDER_SITE_OTHER): Admitting: Otolaryngology

## 2024-03-24 VITALS — BP 103/67 | HR 63

## 2024-03-24 DIAGNOSIS — J3489 Other specified disorders of nose and nasal sinuses: Secondary | ICD-10-CM | POA: Diagnosis not present

## 2024-03-24 DIAGNOSIS — K219 Gastro-esophageal reflux disease without esophagitis: Secondary | ICD-10-CM

## 2024-03-24 DIAGNOSIS — Z923 Personal history of irradiation: Secondary | ICD-10-CM

## 2024-03-24 DIAGNOSIS — J029 Acute pharyngitis, unspecified: Secondary | ICD-10-CM

## 2024-03-24 DIAGNOSIS — R1310 Dysphagia, unspecified: Secondary | ICD-10-CM

## 2024-03-24 DIAGNOSIS — C210 Malignant neoplasm of anus, unspecified: Secondary | ICD-10-CM | POA: Diagnosis not present

## 2024-03-24 DIAGNOSIS — R9402 Abnormal brain scan: Secondary | ICD-10-CM | POA: Diagnosis not present

## 2024-03-24 LAB — RAD ONC ARIA SESSION SUMMARY
Course Elapsed Days: 4
Plan Fractions Treated to Date: 5
Plan Prescribed Dose Per Fraction: 1.8 Gy
Plan Total Fractions Prescribed: 30
Plan Total Prescribed Dose: 54 Gy
Reference Point Dosage Given to Date: 9 Gy
Reference Point Session Dosage Given: 1.8 Gy
Session Number: 5

## 2024-03-24 MED ORDER — AMOXICILLIN-POT CLAVULANATE 875-125 MG PO TABS
1.0000 | ORAL_TABLET | Freq: Two times a day (BID) | ORAL | 0 refills | Status: DC
Start: 2024-03-24 — End: 2024-03-27

## 2024-03-24 MED ORDER — METHYLPREDNISOLONE 4 MG PO TBPK: ORAL_TABLET | ORAL | 1 refills | Status: AC

## 2024-03-24 NOTE — Progress Notes (Signed)
 ENT CONSULT:  Reason for Consult: FDG avid uptake on PET/CT posterior septum, hx of tonsillar cancer treated several years ago in [place]  HPI: Discussed the use of AI scribe software for clinical note transcription with the patient, who gave verbal consent to proceed.  History of Present Illness Derek Booker is a 78 year old male with a history of tonsillar cancer treated with chemo-radiation at [facility], who presents with abnormal uptake on PET/CT in the area of posterior septum.  He developed a sore throat two days ago, and has had pain with swallowing. He has not taken any medication for the sore throat, only using salt water gargles. No changes in his swallowing but he avoids foods due to pain.   He has a history of head and neck cancer treated with radiation approximately nine years ago. He is currently undergoing chemotherapy for anal cancer.   He has a history of heartburn and reflux and is currently taking omeprazole for this condition. No fevers, chills. No neck swelling.    Records Reviewed:  ED note 01/12/24 Derek Booker is a 78 y.o. 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 States he tripped over vines while working in his yard. Pain score 8/10. Worse with deep breathing, standing, moving around. Pt is on Xarelto , but denies hitting head, losing consciousness, or neck pain. Fell onto right side and right arm and has abrasions/skin tears to right elbow but no arm/shoulder pain. No f/c, urinary sxs, extremity pain. Otherwise was in his NSOH.     Past Medical History:  Diagnosis Date   DVT (deep venous thrombosis) (HCC)    Hyperlipidemia    Hypertension    Seizures (HCC)    Throat cancer [facility])     Past Surgical History:  Procedure Laterality Date   CHOLECYSTECTOMY     ORTHOPEDIC SURGERY      Family History  Problem  Relation Age of Onset   Cancer Mother 25       breast cancer    Social History:  reports that he has never smoked. He has never used smokeless tobacco. He reports that he does not drink alcohol and does not use drugs.  Allergies:  Allergies  Allergen Reactions   Nitrates, Organic Other (See Comments)    headaches   Chocolate Other (See Comments)   Sulfites Other (See Comments)    Medications: I have reviewed the patient's current medications.  The PMH, PSH, Medications, Allergies, and SH were reviewed and updated.  ROS: Constitutional: Negative for fever, weight loss and weight gain. Cardiovascular: Negative for chest pain and dyspnea on exertion. Respiratory: Is not experiencing shortness of breath at rest. Gastrointestinal: Negative for nausea and vomiting. Neurological: Negative for headaches. Psychiatric: The patient is not nervous/anxious  Blood pressure 103/67, pulse 63, SpO2 96%. There is no height or weight on file to calculate BMI.  PHYSICAL EXAM:  Exam: General: Well-developed, well-nourished Communication and Voice: Clear pitch and clarity Respiratory Respiratory effort: Equal inspiration and expiration without stridor Cardiovascular Peripheral Vascular: Warm extremities with equal color/perfusion Eyes: No nystagmus with equal extraocular motion bilaterally Neuro/Psych/Balance: Patient oriented to person, place, and time; Appropriate mood and affect; Gait is intact with no imbalance; Cranial nerves I-XII are intact Head and Face Inspection: Normocephalic and atraumatic without mass or lesion Palpation: Facial skeleton intact without bony  stepoffs Salivary Glands: No mass or tenderness Facial Strength: Facial motility symmetric and full bilaterally ENT Pinna: External ear intact and fully developed External canal: Canal is patent with intact skin Tympanic Membrane: Clear and mobile External Nose: No scar or anatomic deformity Internal Nose: Septum is  deviated to the right. There is a septal perforation. There are no changes along the posterior septum or floor of the nasal passages, no masses noted. No polyp, or purulence. Mucosal edema and erythema present.  Bilateral inferior turbinate hypertrophy.  Lips, Teeth, and gums: Mucosa and teeth intact and viable TMJ: No pain to palpation with full mobility Oral cavity/oropharynx: No erythema or exudate, no lesions present Post-XRT changes no masses noted  Nasopharynx: No mass or lesion with intact mucosa Hypopharynx: Intact mucosa with pooling of secretions L > R pyriform Larynx Glottic: Full true vocal cord mobility without lesion or mass Supraglottic: Normal appearing epiglottis and AE folds Interarytenoid Space: Moderate pachydermia&edema Subglottic Space: Patent without lesion or edema Neck Neck and Trachea: Midline trachea without mass or lesion Thyroid: No mass or nodularity Lymphatics: No lymphadenopathy  Procedure: Preoperative diagnosis: sore throat hx tonsillar cancer treated with XRT 9 yrs ago  Postoperative diagnosis:   Same  Procedure: Flexible fiberoptic laryngoscopy  Surgeon: Elena Larry, MD  Anesthesia: Topical lidocaine  and Afrin Complications: None Condition is stable throughout exam  Indications and consent:  The patient presents to the clinic with above symptoms. Indirect laryngoscopy view was incomplete. Thus it was recommended that they undergo a flexible fiberoptic laryngoscopy. All of the risks, benefits, and potential complications were reviewed with the patient preoperatively and verbal informed consent was obtained.  Procedure: The patient was seated upright in the clinic. Topical lidocaine  and Afrin were applied to the nasal cavity. After adequate anesthesia had occurred, I then proceeded to pass the flexible telescope into the nasal cavity. The nasal cavity was patent without rhinorrhea or polyp. The nasopharynx was also patent without mass or lesion.  The base of tongue was visualized and was normal. There were no signs of pooling of secretions in the piriform sinuses. The true vocal folds were mobile bilaterally. There were no signs of glottic or supraglottic mucosal lesion or mass. There was moderate interarytenoid pachydermia and post cricoid edema. The telescope was then slowly withdrawn and the patient tolerated the procedure throughout.      PROCEDURE NOTE: nasal endoscopy  Preoperative diagnosis: abnormal uptake on PET CT along the septum  Postoperative diagnosis: same  Procedure: Diagnostic nasal endoscopy (68768)  Surgeon: Elena Larry, M.D.  Anesthesia: Topical lidocaine  and Afrin  H&P REVIEW: The patient's history and physical were reviewed today prior to procedure. All medications were reviewed and updated as well. Complications: None Condition is stable throughout exam Indications and consent: The patient presents with symptoms of chronic sinusitis not responding to previous therapies. All the risks, benefits, and potential complications were reviewed with the patient preoperatively and informed consent was obtained. The time out was completed with confirmation of the correct procedure.   Procedure: The patient was seated upright in the clinic. Topical lidocaine  and Afrin were applied to the nasal cavity. After adequate anesthesia had occurred, the rigid nasal endoscope was passed into the nasal cavity. The nasal mucosa, turbinates, septum, and sinus drainage pathways were visualized bilaterally. This revealed no purulence or significant secretions that might be cultured. There were no polyps or sites of significant inflammation. The mucosa was intact and there was no crusting present. The scope was then slowly withdrawn and  the patient tolerated the procedure well. There were no complications or blood loss.   Studies Reviewed: PET CT 03/08/24 Narrative & Impression  CLINICAL DATA:  Initial treatment strategy for anal  cancer.   EXAM: NUCLEAR MEDICINE PET SKULL BASE TO THIGH   TECHNIQUE: 8.72 mCi F-18 FDG was injected intravenously. Full-ring PET imaging was performed from the skull base to thigh after the radiotracer. CT data was obtained and used for attenuation correction and anatomic localization.   Fasting blood glucose: 87 mg/dl   COMPARISON:  MRI pelvis 03/02/2024. CT scan chest abdomen pelvis 01/12/2024   FINDINGS: Mediastinal blood pool activity: SUV max 2.2   Liver activity: SUV max 2.4   NECK: No specific abnormal uptake seen above blood pool in the neck including along lymph node change of the submandibular, posterior triangle or internal jugular region. Slight uptake along the bony nasal septum, nonspecific. Please correlate with direct visualization. This has maximum SUV value 5.6.   Incidental CT findings: The parotid glands are preserved. There is atrophy of the left submandibular gland relative to the right. Atrophy of the thyroid gland. Paranasal sinuses and mastoid air cells are clear. There is streak artifact related to the patient's dental hardware.   CHEST: No specific abnormal uptake above blood pool in the axillary regions, hilum or mediastinum. No specific abnormal uptake along the lung parenchyma.   Incidental CT findings: Scattered vascular calcifications identified including along the aorta and coronary arteries. Trace pericardial fluid. Slightly patulous thoracic esophagus. Breathing motion. There is some linear opacity at the bases likely scar or atelectasis. No pleural effusion.   ABDOMEN/PELVIS: There is a focal area of abnormal uptake along the rectum with maximum SUV value 13.9 corresponding to a mass lesion as seen on previous examinations consistent with neoplasm. Again there is extension of tumor into the right perirectal space. Please correlate with prior MRI. There is also an area of abnormal uptake more caudal towards the anorectal junction  with maximum SUV value of 10.1. This is centered to the right. Second area of disease is possible.   Otherwise there is physiologic distribution radiotracer along the parenchymal organs and renal collecting systems. No specific areas of abnormal nodal uptake identified this time.   Incidental CT findings: Please correlate with recent contrast CT scan. Grossly on this limited examination, the liver, spleen, adrenal glands and pancreas are unremarkable. Previous cholecystectomy. Bilateral nonobstructing renal stones. No ureteral stones. Contracted urinary bladder. Diffuse colonic stool. Few colonic diverticula. Atypical distribution of bowel with a loop of colon extending between the liver in the anterior abdominal wall. Stomach and small bowel are nondilated. Motion. Diffuse vascular calcifications are seen including along the aorta and branch vessels. Small fat containing left inguinal hernia. Surgical clips along the scrotum.   SKELETON: There is some areas of uptake along the lower right-sided ribs where there are areas of healing fracture. These very well could be posttraumatic areas of uptake. No additional areas of abnormal bony uptake.   Incidental CT findings: Curvature and degenerative changes along the spine.   IMPRESSION: Abnormal area of uptake and mass along the rectum consistent with known neoplasm. Second area just caudal as well towards the anorectal junction. Please correlate with clinical history.   No additional areas of abnormal uptake at this time suggest metastatic disease. No nodal uptake or hepatic uptake.   There is atypical area of increased radiotracer along the nasal septum posteriorly. Please correlate with history and direct visualization to exclude subtle lesion.  Areas of uptake along healing right-sided rib fractures.    Assessment/Plan: Encounter Diagnoses  Name Primary?   Sore throat    Chronic GERD    History of head and neck  radiation    Abnormal positron emission tomography (PET) scan of head Yes   Dysphagia, unspecified type     Assessment and Plan Assessment & Plan Sore throat and odynophagia x 2 days, receiving chemo for anal cancer currently Exam including scope today without erythema, no tonsillar cancer recurrence - Prescribed Augmentin  and steroids. - Advised salt water gargles and hydration.  Abnormal FDG avid uptake along posterior nasal septum Area examined with scope exam and there are no concerning findings, no mucosal changes or masses noted  Septal perforation Septal perforation noted, no immediate concerns. - monitor for now  Dysphagia hx of tonsillar cancer s/p XRT Pooling of secretions in L > R pyriform and XRT changes on scope exam  We discussed swallow evaluation and swallow therapy but he would like to hold off at this time - monitor for now    Thank you for allowing me to participate in the care of this patient. Please do not hesitate to contact me with any questions or concerns.   Elena Larry, MD Otolaryngology [facility] Phone: [phone] Fax: [phone]    03/24/2024, 10:15 AM

## 2024-03-24 NOTE — Patient Instructions (Signed)
 [facility] - A DEPT OF [place]  Discharge Instructions: Thank you for choosing [facility] to provide your oncology and hematology care.   If you have a lab appointment with the Cancer Center, please go directly to the Cancer Center and check in at the registration area.   Wear comfortable clothing and clothing appropriate for easy access to any Portacath or PICC line.   We strive to give you quality time with your provider. You may need to reschedule your appointment if you arrive late (15 or more minutes).  Arriving late affects you and other patients whose appointments are after yours.  Also, if you miss three or more appointments without notifying the office, you may be dismissed from the clinic at the provider's discretion.      For prescription refill requests, have your pharmacy contact our office and allow 72 hours for refills to be completed.    Today you received the following chemotherapy and/or immunotherapy agents: mitomycin  and fluorouracil .    Fluorouracil  Injection What is this medication? FLUOROURACIL  (flure oh YOOR a sil) treats some types of cancer. It works by slowing down the growth of cancer cells. This medicine may be used for other purposes; ask your health care provider or pharmacist if you have questions. COMMON BRAND NAME(S): Adrucil  What should I tell my care team before I take this medication? They need to know if you have any of these conditions: Blood disorders Dihydropyrimidine dehydrogenase (DPD) deficiency Infection, such as chickenpox, cold sores, herpes Kidney disease Liver disease Poor nutrition Recent or ongoing radiation therapy An unusual or allergic reaction to fluorouracil , other medications, foods, dyes, or preservatives If you or your partner are pregnant or trying to get pregnant Breast-feeding How should I use this medication? This medication is injected into a vein. It is administered by  your care team in a hospital or clinic setting. Talk to your care team about the use of this medication in children. Special care may be needed. Overdosage: If you think you have taken too much of this medicine contact a poison control center or emergency room at once. NOTE: This medicine is only for you. Do not share this medicine with others. What if I miss a dose? Keep appointments for follow-up doses. It is important not to miss your dose. Call your care team if you are unable to keep an appointment. What may interact with this medication? Do not take this medication with any of the following: Live virus vaccines This medication may also interact with the following: Medications that treat or prevent blood clots, such as warfarin, enoxaparin, dalteparin This list may not describe all possible interactions. Give your health care provider a list of all the medicines, herbs, non-prescription drugs, or dietary supplements you use. Also tell them if you smoke, drink alcohol, or use illegal drugs. Some items may interact with your medicine. What should I watch for while using this medication? Your condition will be monitored carefully while you are receiving this medication. This medication may make you feel generally unwell. This is not uncommon as chemotherapy can affect healthy cells as well as cancer cells. Report any side effects. Continue your course of treatment even though you feel ill unless your care team tells you to stop. In some cases, you may be given additional medications to help with side effects. Follow all directions for their use. This medication may increase your risk of getting an infection. Call your  care team for advice if you get a fever, chills, sore throat, or other symptoms of a cold or flu. Do not treat yourself. Try to avoid being around people who are sick. This medication may increase your risk to bruise or bleed. Call your care team if you notice any unusual  bleeding. Be careful brushing or flossing your teeth or using a toothpick because you may get an infection or bleed more easily. If you have any dental work done, tell your dentist you are receiving this medication. Avoid taking medications that contain aspirin , acetaminophen , ibuprofen, naproxen, or ketoprofen unless instructed by your care team. These medications may hide a fever. Do not treat diarrhea with over the counter products. Contact your care team if you have diarrhea that lasts more than 2 days or if it is severe and watery. This medication can make you more sensitive to the sun. Keep out of the sun. If you cannot avoid being in the sun, wear protective clothing and sunscreen. Do not use sun lamps, tanning beds, or tanning booths. Talk to your care team if you or your partner wish to become pregnant or think you might be pregnant. This medication can cause serious birth defects if taken during pregnancy and for 3 months after the last dose. A reliable form of contraception is recommended while taking this medication and for 3 months after the last dose. Talk to your care team about effective forms of contraception. Do not father a child while taking this medication and for 3 months after the last dose. Use a condom while having sex during this time period. Do not breastfeed while taking this medication. This medication may cause infertility. Talk to your care team if you are concerned about your fertility. What side effects may I notice from receiving this medication? Side effects that you should report to your care team as soon as possible: Allergic reactions--skin rash, itching, hives, swelling of the face, lips, tongue, or throat Heart attack--pain or tightness in the chest, shoulders, arms, or jaw, nausea, shortness of breath, cold or clammy skin, feeling faint or lightheaded Heart failure--shortness of breath, swelling of the ankles, feet, or hands, sudden weight gain, unusual weakness  or fatigue Heart rhythm changes--fast or irregular heartbeat, dizziness, feeling faint or lightheaded, chest pain, trouble breathing High ammonia level--unusual weakness or fatigue, confusion, loss of appetite, nausea, vomiting, seizures Infection--fever, chills, cough, sore throat, wounds that don't heal, pain or trouble when passing urine, general feeling of discomfort or being unwell Low red blood cell level--unusual weakness or fatigue, dizziness, headache, trouble breathing Pain, tingling, or numbness in the hands or feet, muscle weakness, change in vision, confusion or trouble speaking, loss of balance or coordination, trouble walking, seizures Redness, swelling, and blistering of the skin over hands and feet Severe or prolonged diarrhea Unusual bruising or bleeding Side effects that usually do not require medical attention (report to your care team if they continue or are bothersome): Dry skin Headache Increased tears Nausea Pain, redness, or swelling with sores inside the mouth or throat Sensitivity to light Vomiting This list may not describe all possible side effects. Call your doctor for medical advice about side effects. You may report side effects to FDA at 1-800-FDA-1088. Where should I keep my medication? This medication is given in a hospital or clinic. It will not be stored at home. NOTE: This sheet is a summary. It may not cover all possible information. If you have questions about this medicine, talk to your doctor,  pharmacist, or health care provider.  2024 Elsevier/Gold Standard (2021-12-02 00:00:00) Mitomycin  Injection What is this medication? MITOMYCIN  (mye toe MYE sin) treats stomach cancer and pancreatic cancer. It works by slowing down the growth of cancer cells. This medicine may be used for other purposes; ask your health care provider or pharmacist if you have questions. COMMON BRAND NAME(S): Mutamycin  What should I tell my care team before I take this  medication? They need to know if you have any of these conditions: Bleeding disorders Infection, such as chickenpox, cold sores, herpes Low blood counts, such as low white cells, platelets, red blood cells Kidney disease An unusual or allergic reaction to mitomycin , other medications, foods, dyes, or preservatives Pregnant or trying to get pregnant Breastfeeding How should I use this medication? This medication is injected into a vein. It is given by your care team in a hospital or clinic setting. Talk to your care team about the use of this medication in children. Special care may be needed. Overdosage: If you think you have taken too much of this medicine contact a poison control center or emergency room at once. NOTE: This medicine is only for you. Do not share this medicine with others. What if I miss a dose? Keep appointments for follow-up doses. It is important not to miss your dose. Call your care team if you are unable to keep an appointment. What may interact with this medication? Interactions are not expected. This list may not describe all possible interactions. Give your health care provider a list of all the medicines, herbs, non-prescription drugs, or dietary supplements you use. Also tell them if you smoke, drink alcohol, or use illegal drugs. Some items may interact with your medicine. What should I watch for while using this medication? Your condition will be monitored carefully while you are receiving this medication. You may need blood work while taking this medication. This medication may make you feel generally unwell. This is not uncommon as chemotherapy can affect healthy cells as well as cancer cells. Report any side effects. Continue your course of treatment even though you feel ill unless your care team tells you to stop. This medication may increase your risk of getting an infection. Call your care team for advice if you get a fever, chills, sore throat, or other  symptoms of a cold or flu. Do not treat yourself. Try to avoid being around people who are sick. Avoid taking medications that contain aspirin , acetaminophen , ibuprofen, naproxen, or ketoprofen unless instructed by your care team. These medications may hide a fever. This medication may increase your risk to bruise or bleed. Call your care team if you notice any unusual bleeding. Be careful brushing or flossing your teeth or using a toothpick because you may get an infection or bleed more easily. If you have any dental work done, tell your dentist you are receiving this medication. Talk to your care team if you may be pregnant. Serious birth defects can occur if you take this medication during pregnancy. Contraception is recommended while taking this medication. Your care team can help you find the option that works for you. Do not breastfeed while taking this medication. What side effects may I notice from receiving this medication? Side effects that you should report to your care team as soon as possible: Allergic reactions--skin rash, itching, hives, swelling of the face, lips, tongue, or throat Dry cough, shortness of breath or trouble breathing Infection--fever, chills, cough, sore throat, wounds that don't heal, pain or  trouble when passing urine, general feeling of discomfort or being unwell Kidney injury--decrease in the amount of urine, swelling of the ankles, hands, or feet Low red blood cell level--unusual weakness or fatigue, dizziness, headache, trouble breathing Stomach pain, bloody diarrhea, pale skin, unusual weakness or fatigue, decrease in the amount of urine, which may be signs of hemolytic uremic syndrome Unusual bruising or bleeding Side effects that usually do not require medical attention (report these to your care team if they continue or are bothersome): Diarrhea Hair loss Loss of appetite with weight loss Nausea Pain, redness, or swelling with sores inside the mouth or  throat This list may not describe all possible side effects. Call your doctor for medical advice about side effects. You may report side effects to FDA at 1-800-FDA-1088. Where should I keep my medication? This medication is given in a hospital or clinic. It will not be stored at home. NOTE: This sheet is a summary. It may not cover all possible information. If you have questions about this medicine, talk to your doctor, pharmacist, or health care provider.  2024 Elsevier/Gold Standard (2021-12-18 00:00:00)     To help prevent nausea and vomiting after your treatment, we encourage you to take your nausea medication as directed.  BELOW ARE SYMPTOMS THAT SHOULD BE REPORTED IMMEDIATELY: *FEVER GREATER THAN 100.4 F (38 C) OR HIGHER *CHILLS OR SWEATING *NAUSEA AND VOMITING THAT IS NOT CONTROLLED WITH YOUR NAUSEA MEDICATION *UNUSUAL SHORTNESS OF BREATH *UNUSUAL BRUISING OR BLEEDING *URINARY PROBLEMS (pain or burning when urinating, or frequent urination) *BOWEL PROBLEMS (unusual diarrhea, constipation, pain near the anus) TENDERNESS IN MOUTH AND THROAT WITH OR WITHOUT PRESENCE OF ULCERS (sore throat, sores in mouth, or a toothache) UNUSUAL RASH, SWELLING OR PAIN  UNUSUAL VAGINAL DISCHARGE OR ITCHING   Items with * indicate a potential emergency and should be followed up as soon as possible or go to the Emergency Department if any problems should occur.  Please show the CHEMOTHERAPY ALERT CARD or IMMUNOTHERAPY ALERT CARD at check-in to the Emergency Department and triage nurse.  Should you have questions after your visit or need to cancel or reschedule your appointment, please contact [facility] - A DEPT OF [facility]  Dept: [phone]  and follow the prompts.  Office hours are 8:00 a.m. to 4:30 p.m. Monday - Friday. Please note that voicemails left after 4:00 p.m. may not be returned until the following business day.  We are closed weekends and major  holidays. You have access to a nurse at all times for urgent questions. Please call the main number to the clinic Dept: [phone] and follow the prompts.   For any non-urgent questions, you may also contact your provider using MyChart. We now offer e-Visits for anyone 44 and older to request care online for non-urgent symptoms. For details visit [email or web address].   Also download the MyChart app! Go to the app store, search MyChart, open the app, select [facility], and log in with your MyChart username and password.

## 2024-03-27 ENCOUNTER — Inpatient Hospital Stay

## 2024-03-27 ENCOUNTER — Inpatient Hospital Stay (HOSPITAL_BASED_OUTPATIENT_CLINIC_OR_DEPARTMENT_OTHER): Admitting: Hematology

## 2024-03-27 ENCOUNTER — Ambulatory Visit
Admission: RE | Admit: 2024-03-27 | Discharge: 2024-03-27 | Disposition: A | Discharge status: Home / Self Care | Source: Ambulatory Visit | Attending: Radiation Oncology | Admitting: Radiation Oncology

## 2024-03-27 ENCOUNTER — Other Ambulatory Visit (HOSPITAL_COMMUNITY): Payer: Self-pay

## 2024-03-27 ENCOUNTER — Encounter: Payer: Self-pay | Admitting: Hematology

## 2024-03-27 ENCOUNTER — Other Ambulatory Visit: Payer: Self-pay

## 2024-03-27 VITALS — BP 98/60 | HR 60 | Temp 98.0°F | Resp 17 | Ht 72.0 in | Wt 164.1 lb

## 2024-03-27 DIAGNOSIS — Z452 Encounter for adjustment and management of vascular access device: Secondary | ICD-10-CM

## 2024-03-27 DIAGNOSIS — C210 Malignant neoplasm of anus, unspecified: Secondary | ICD-10-CM | POA: Diagnosis not present

## 2024-03-27 LAB — CBC WITH DIFFERENTIAL (CANCER CENTER ONLY)
Abs Immature Granulocytes: 0.07 K/uL (ref 0.00–0.07)
Basophils Absolute: 0 K/uL (ref 0.0–0.1)
Basophils Relative: 1 %
Eosinophils Absolute: 0 K/uL (ref 0.0–0.5)
Eosinophils Relative: 1 %
HCT: 38.7 % — ABNORMAL LOW (ref 39.0–52.0)
Hemoglobin: 12.6 g/dL — ABNORMAL LOW (ref 13.0–17.0)
Immature Granulocytes: 2 %
Lymphocytes Relative: 6 %
Lymphs Abs: 0.2 K/uL — ABNORMAL LOW (ref 0.7–4.0)
MCH: 33 pg (ref 26.0–34.0)
MCHC: 32.6 g/dL (ref 30.0–36.0)
MCV: 101.3 fL — ABNORMAL HIGH (ref 80.0–100.0)
Monocytes Absolute: 0.1 K/uL (ref 0.1–1.0)
Monocytes Relative: 2 %
Neutro Abs: 3.4 K/uL (ref 1.7–7.7)
Neutrophils Relative %: 88 %
Platelet Count: 124 K/uL — ABNORMAL LOW (ref 150–400)
RBC: 3.82 MIL/uL — ABNORMAL LOW (ref 4.22–5.81)
RDW: 13.2 % (ref 11.5–15.5)
WBC Count: 3.8 K/uL — ABNORMAL LOW (ref 4.0–10.5)
nRBC: 0 % (ref 0.0–0.2)

## 2024-03-27 LAB — CMP (CANCER CENTER ONLY)
ALT: 13 U/L (ref 0–44)
AST: 15 U/L (ref 15–41)
Albumin: 3.9 g/dL (ref 3.5–5.0)
Alkaline Phosphatase: 72 U/L (ref 38–126)
Anion gap: 5 (ref 5–15)
BUN: 31 mg/dL — ABNORMAL HIGH (ref 8–23)
CO2: 23 mmol/L (ref 22–32)
Calcium: 9.1 mg/dL (ref 8.9–10.3)
Chloride: 114 mmol/L — ABNORMAL HIGH (ref 98–111)
Creatinine: 0.99 mg/dL (ref 0.61–1.24)
GFR, Estimated: >60 mL/min (ref >60)
Glucose, Bld: 98 mg/dL (ref 70–99)
Potassium: 3.8 mmol/L (ref 3.5–5.1)
Sodium: 142 mmol/L (ref 135–145)
Total Bilirubin: 1.1 mg/dL (ref 0.0–1.2)
Total Protein: 7 g/dL (ref 6.5–8.1)

## 2024-03-27 LAB — RAD ONC ARIA SESSION SUMMARY
Course Elapsed Days: 7
Plan Fractions Treated to Date: 6
Plan Prescribed Dose Per Fraction: 1.8 Gy
Plan Total Fractions Prescribed: 30
Plan Total Prescribed Dose: 54 Gy
Reference Point Dosage Given to Date: 10.8 Gy
Reference Point Session Dosage Given: 1.8 Gy
Session Number: 6

## 2024-03-27 MED ORDER — SODIUM CHLORIDE 0.9% FLUSH
10.0000 mL | Freq: Once | INTRAVENOUS | Status: AC
  Administered 2024-03-27: 10 mL

## 2024-03-27 MED ORDER — NYSTATIN 100000 UNIT/ML MT SUSP
5.0000 mL | Freq: Four times a day (QID) | OROMUCOSAL | 1 refills | Status: DC | PRN
  Filled 2024-03-27: qty 140, 7d supply, fill #0
  Filled 2024-03-29: qty 140, 7d supply, fill #1

## 2024-03-27 NOTE — Assessment & Plan Note (Signed)
 cT3N0M0 - Patient presented with frequent and loose bowel movements -Colonoscopy showed 2 masses in upper and lower rectum, post biopsy showed squamous cell carcinoma.  No nodal metastasis on MRI. - PET scan was negative for nodal or distant metastasis -He started concurrent chemoradiation with 5-FU and mitomycin  on March 20, 2024

## 2024-03-27 NOTE — Progress Notes (Signed)
 [facility]   [phone] [phone]   Clinic Follow up Note   [name], MD as PCP - General (Hematology) [name], [name], RN as Oncology Nurse Navigator  Date of Service:  03/27/2024  CHIEF COMPLAINT: f/u of anal cancer  CURRENT THERAPY:  Concurrent chemoradiation  Oncology History   Anal cancer (HCC) cT3N0M0 - Patient presented with frequent and loose bowel movements -Colonoscopy showed 2 masses in upper and lower rectum, post biopsy showed squamous cell carcinoma.  No nodal metastasis on MRI. - PET scan was negative for nodal or distant metastasis -He started concurrent chemoradiation with 5-FU and mitomycin  on March 20, 2024  Assessment & Plan Anal cancer receiving chemotherapy Currently undergoing intensive chemotherapy for anal cancer with side effects including oral mucositis and diarrhea. Chemotherapy may cause further side effects, including potential infection due to decreased blood counts. He is aware of the potential need for hospitalization if symptoms worsen. - Continue chemotherapy regimen - Monitor for fever and chills as signs of infection - Advise to contact healthcare provider or visit emergency room if fever or chills occur  Oral mucositis due to chemotherapy Severe oral mucositis secondary to chemotherapy, causing significant discomfort and difficulty eating. ENT evaluation confirmed raw throat and prescribed antibiotics and steroids. Symptoms are expected to improve over time. - Prescribe magic mouthwash containing steroid, lidocaine , Benadryl, and nystatin  - Advise liquid nutrition for the next week  Chemotherapy-induced diarrhea Diarrhea secondary to chemotherapy, initially severe but improving. Previously treated with Lomotil , which is stronger than Imodium. Antibiotics may have contributed to diarrhea. - Discontinue antibiotics as they are not needed for bacterial infection - Continue using  Lomotil  or Imodium as needed to control diarrhea - Monitor bowel movements to ensure he does not exceed two to three times a day  Dehydration Dehydration due to inadequate fluid intake and diarrhea, leading to significant weight loss. - Administer IV fluids today or tomorrow - Encourage increased fluid intake, aiming for 60-80 ounces per day - Order lab tests to assess blood counts and kidney/liver function  Unintentional weight loss Significant weight loss of seven pounds, likely due to oral mucositis and decreased oral intake. Nutritional intake is insufficient, with reliance on Ensure for calories. - Refer to dietitian for urgent nutritional assessment - Encourage high-calorie, high-protein diet with soft foods - Monitor weight closely  Insomnia Difficulty sleeping, potentially exacerbated by anxiety and frequent need to use the bathroom due to diarrhea. - Consider use of Z-Quil or similar for sleep aid - Encourage use of Imodium or Lomotil  before bed to reduce nighttime bathroom trips  Anxiety Anxiety related to treatment and side effects, contributing to insomnia and overall distress. He is anxious about treatment outcomes and side effects. - Provide reassurance about treatment side effects and expected improvement - Encourage communication with healthcare team via MyChart for concerns   Plan - Will call him Magic mouthwash - I encouraged him to increase oral intake, especially liquid nutrition and fluids - Will arrange 2-hour IV fluids tomorrow, then pull his PICC line - Urgent nutrition referral - Follow-up next week.  He knows to call us  back if his symptom not improving later this week  SUMMARY OF ONCOLOGIC HISTORY: Oncology History  Anal cancer (HCC)  03/07/2024 Initial Diagnosis   Anal cancer (HCC)   03/07/2024 Cancer Staging   Staging form: Anus, AJCC V9 - Clinical: Stage IIIA (cT3, cN0, cM0) - Signed by [name], MD on 03/20/2024   03/20/2024 -  Chemotherapy    Patient is on Treatment Plan : ANUS Mitomycin  D1,28 + 5FU D1-4, 28-31 q32d        Discussed the use of AI scribe software for clinical note transcription with the patient, who gave verbal consent to proceed.  History of Present Illness Derek Booker is a 78 year old male with anal cancer who presents for follow-up of mucositis.  He experiences severe sore throat and mucositis since last Wednesday, with rapid symptom development. He initially used salt water gargles and was prescribed antibiotics and steroids after an ENT evaluation.  Mucositis causes difficulty eating and drinking, limiting him to soft foods like ice cream and mashed potatoes. He drinks Ensure for nutrition but has inadequate intake, resulting in a seven-pound weight loss. He attempts hydration with Gatorlite but finds it insufficient.  Diarrhea occurred up to ten times daily, now reduced to four to five times daily, managed with a medication stronger than Imodium, possibly Lomotil . He experiences no lightheadedness but has difficulty sleeping due to frequent bathroom trips.  Current medications include antibiotics, steroids, liquid ibuprofen, Algenitron, Compazine , gabapentin, lisinopril, B12, Aricept, and Synthroid .     All other systems were reviewed with the patient and are negative.  MEDICAL HISTORY:  Past Medical History:  Diagnosis Date   DVT (deep venous thrombosis) (HCC)    Hyperlipidemia    Hypertension    Seizures (HCC)    Throat cancer (HCC)     SURGICAL HISTORY: Past Surgical History:  Procedure Laterality Date   CHOLECYSTECTOMY     ORTHOPEDIC SURGERY      I have reviewed the social history and family history with the patient and they are unchanged from previous note.  ALLERGIES:  is allergic to nitrates, organic; chocolate; and sulfites.  MEDICATIONS:  Current Outpatient Medications  Medication Sig Dispense Refill   cyanocobalamin (VITAMIN B12) 1000 MCG tablet Take 1,000 mcg by mouth  daily.     donepezil (ARICEPT) 10 MG tablet Take 10 mg by mouth daily.     gabapentin (NEURONTIN) 100 MG capsule Take 100 mg by mouth 3 (three) times daily.     levothyroxine  (SYNTHROID , LEVOTHROID) 112 MCG tablet Take 112 mcg by mouth daily before breakfast.     lisinopril (ZESTRIL) 20 MG tablet Take 20 mg by mouth daily.     magic mouthwash w/lidocaine  SOLN Take 5 mLs by mouth 4 (four) times daily as needed for mouth pain. Swish and Swallow 140 mL 1   methylPREDNISolone  (MEDROL  DOSEPAK) 4 MG TBPK tablet Take with signs of chronic sinusitis and take as directed 1 each 1   mirabegron ER (MYRBETRIQ) 50 MG TB24 tablet Take 50 mg by mouth daily.     omeprazole (PRILOSEC) 20 MG capsule Take 20 mg by mouth daily.     ondansetron  (ZOFRAN ) 8 MG tablet Take 1 tablet (8 mg total) by mouth every 8 (eight) hours as needed for nausea or vomiting. 30 tablet 2   pravastatin  (PRAVACHOL ) 40 MG tablet Take 40 mg by mouth daily.     prochlorperazine  (COMPAZINE ) 10 MG tablet Take 1 tablet (10 mg total) by mouth every 6 (six) hours as needed for nausea or vomiting. 30 tablet 1   rivaroxaban  (XARELTO ) 20 MG TABS tablet Take 20 mg by mouth daily.     sertraline  (ZOLOFT ) 50 MG tablet Take 50 mg by mouth daily.     solifenacin (VESICARE) 5 MG tablet Take 5 mg by mouth daily.     SUMAtriptan  (IMITREX ) 100 MG  tablet Take 100 mg by mouth every 2 (two) hours as needed for migraine. May repeat in 2 hours if headache persists or recurs.     topiramate  (TOPAMAX ) 50 MG tablet Take 50 mg by mouth 2 (two) times daily.     No current facility-administered medications for this visit.   Facility-Administered Medications Ordered in Other Visits  Medication Dose Route Frequency Provider Last Rate Last Admin   sodium chloride  flush (NS) 0.9 % injection 10 mL  10 mL Intracatheter Once [name], MD        PHYSICAL EXAMINATION: ECOG PERFORMANCE STATUS: 2 - Symptomatic, <50% confined to bed  Vitals:   03/27/24 1511 03/27/24 1512   BP:    Pulse:    Resp:    Temp:    SpO2: 99% 97%   Wt Readings from Last 3 Encounters:  03/27/24 164 lb 1.6 oz (74.4 kg)  03/20/24 171 lb 11.2 oz (77.9 kg)  03/08/24 (P) 174 lb (78.9 kg)     GENERAL:alert, no distress and comfortable SKIN: skin color, texture, turgor are normal, no rashes or significant lesions EYES: normal, Conjunctiva are pink and non-injected, sclera clear NECK: supple, thyroid normal size, non-tender, without nodularity LYMPH:  no palpable lymphadenopathy in the cervical, axillary  LUNGS: clear to auscultation and percussion with normal breathing effort HEART: regular rate & rhythm and no murmurs and no lower extremity edema ABDOMEN:abdomen soft, non-tender and normal bowel sounds Musculoskeletal:no cyanosis of digits and no clubbing  NEURO: alert & oriented x 3 with fluent speech, no focal motor/sensory deficits HEENT: Oral mucosa red with some whitish areas, consistent with mucositis. Physical Exam    LABORATORY DATA:  I have reviewed the data as listed    Latest Ref Rng & Units 03/27/2024    3:32 PM 03/20/2024   12:10 PM 01/12/2024    9:35 AM  CBC  WBC 4.0 - 10.5 K/uL 3.8  3.8  6.2   Hemoglobin 13.0 - 17.0 g/dL 87.3  87.3  85.1   Hematocrit 39.0 - 52.0 % 38.7  37.6  46.7   Platelets 150 - 400 K/uL 124  182  173         Latest Ref Rng & Units 03/20/2024   12:10 PM 01/12/2024    9:35 AM 12/03/2016    4:57 AM  CMP  Glucose 70 - 99 mg/dL 84  98  892   BUN 8 - 23 mg/dL 22  20  12    Creatinine 0.61 - 1.24 mg/dL 8.96  8.93  8.74   Sodium 135 - 145 mmol/L 138  138  139   Potassium 3.5 - 5.1 mmol/L 3.9  3.9  3.8   Chloride 98 - 111 mmol/L 109  108  110   CO2 22 - 32 mmol/L 25  22  23    Calcium 8.9 - 10.3 mg/dL 8.9  8.9  9.2   Total Protein 6.5 - 8.1 g/dL 6.7  7.5    Total Bilirubin 0.0 - 1.2 mg/dL 0.6  0.5    Alkaline Phos 38 - 126 U/L 79  87    AST 15 - 41 U/L 19  21    ALT 0 - 44 U/L 15  18        RADIOGRAPHIC STUDIES: I have personally  reviewed the radiological images as listed and agreed with the findings in the report. No results found.    Orders Placed This Encounter  Procedures   CBC with Differential/Platelet    Standing  Status:   Standing    Number of Occurrences:   50    Expiration Date:   03/27/2025   Comprehensive metabolic panel with GFR    Standing Status:   Standing    Number of Occurrences:   50    Expiration Date:   03/27/2025   Ambulatory Referral to [facility]    Referral Priority:   Urgent    Referral Type:   Consultation    Referral Reason:   Specialty Services Required    Number of Visits Requested:   1   All questions were answered. The patient knows to call the clinic with any problems, questions or concerns. No barriers to learning was detected. The total time spent in the appointment was 30 minutes, including review of chart and various tests results, discussions about plan of care and coordination of care plan     [name], MD 03/27/2024

## 2024-03-28 ENCOUNTER — Encounter

## 2024-03-28 ENCOUNTER — Other Ambulatory Visit: Payer: Self-pay

## 2024-03-28 ENCOUNTER — Inpatient Hospital Stay

## 2024-03-28 ENCOUNTER — Ambulatory Visit
Admission: RE | Admit: 2024-03-28 | Discharge: 2024-03-28 | Disposition: A | Discharge status: Home / Self Care | Source: Ambulatory Visit | Attending: Radiation Oncology

## 2024-03-28 ENCOUNTER — Inpatient Hospital Stay: Admitting: Hematology

## 2024-03-28 ENCOUNTER — Encounter: Payer: Self-pay | Admitting: Hematology

## 2024-03-28 ENCOUNTER — Ambulatory Visit

## 2024-03-28 VITALS — BP 126/76 | HR 50 | Temp 98.2°F | Resp 18

## 2024-03-28 DIAGNOSIS — C210 Malignant neoplasm of anus, unspecified: Secondary | ICD-10-CM | POA: Diagnosis not present

## 2024-03-28 DIAGNOSIS — Z452 Encounter for adjustment and management of vascular access device: Secondary | ICD-10-CM

## 2024-03-28 LAB — RAD ONC ARIA SESSION SUMMARY
Course Elapsed Days: 8
Plan Fractions Treated to Date: 7
Plan Prescribed Dose Per Fraction: 1.8 Gy
Plan Total Fractions Prescribed: 30
Plan Total Prescribed Dose: 54 Gy
Reference Point Dosage Given to Date: 12.6 Gy
Reference Point Session Dosage Given: 1.8 Gy
Session Number: 7

## 2024-03-28 MED ORDER — SODIUM CHLORIDE 0.9 % IV SOLN: Freq: Once | INTRAVENOUS | Status: AC

## 2024-03-28 NOTE — Progress Notes (Unsigned)
 Cardiology Office Note:    Date:  03/29/2024   ID:  Derek Booker, DOB 1946-02-05, MRN [id number]  PCP:  [name], MD  Cardiologist:  None  Electrophysiologist:  None   Referring MD: No ref. provider found   Chief Complaint  Patient presents with   Hypertension    History of Present Illness:    Derek Booker is a 78 y.o. male with a hx of hypertension, hyperlipidemia, DVT, seizures, tonsillar cancer status post chemoradiation, anal cancer currently on chemotherapy who presents as a initial visit for hypertension.  He was started on chemoradiation with 5-FU and mitomycin  for anal cancer on 03/20/2024.    Denies any chest pain, dyspnea, lightheadedness, syncope, or palpitations.  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  No smoking history.  Family history includes father died of possible MI in 57s.   Past Medical History:  Diagnosis Date   DVT (deep venous thrombosis) (HCC)    Hyperlipidemia    Hypertension    Seizures (HCC)    Throat cancer (HCC)     Past Surgical History:  Procedure Laterality Date   CHOLECYSTECTOMY     ORTHOPEDIC SURGERY      Current Medications: Current Meds  Medication Sig   cyanocobalamin (VITAMIN B12) 1000 MCG tablet Take 1,000 mcg by mouth daily.   donepezil (ARICEPT) 10 MG tablet Take 10 mg by mouth daily.   gabapentin (NEURONTIN) 100 MG capsule Take 100 mg by mouth 3 (three) times daily.   levothyroxine  (SYNTHROID , LEVOTHROID) 112 MCG tablet Take 112 mcg by mouth daily before breakfast.   lisinopril (ZESTRIL) 20 MG tablet Take 20 mg by mouth daily.   magic mouthwash (nystatin , lidocaine , diphenhydrAMINE, alum & mag hydroxide) suspension Take 5 mLs by mouth 4 (four) times daily as needed for mouth pain. Swish and Swallow   methylPREDNISolone  (MEDROL  DOSEPAK) 4 MG TBPK tablet Take with signs of chronic sinusitis and take as directed   mirabegron ER (MYRBETRIQ) 50 MG TB24 tablet Take 50 mg by mouth daily.   omeprazole (PRILOSEC) 20  MG capsule Take 20 mg by mouth daily.   ondansetron  (ZOFRAN ) 8 MG tablet Take 1 tablet (8 mg total) by mouth every 8 (eight) hours as needed for nausea or vomiting.   pravastatin  (PRAVACHOL ) 40 MG tablet Take 40 mg by mouth daily.   prochlorperazine  (COMPAZINE ) 10 MG tablet Take 1 tablet (10 mg total) by mouth every 6 (six) hours as needed for nausea or vomiting.   rivaroxaban  (XARELTO ) 20 MG TABS tablet Take 20 mg by mouth daily.   sertraline  (ZOLOFT ) 50 MG tablet Take 50 mg by mouth daily.   solifenacin (VESICARE) 5 MG tablet Take 5 mg by mouth daily.   SUMAtriptan  (IMITREX ) 100 MG tablet Take 100 mg by mouth every 2 (two) hours as needed for migraine. May repeat in 2 hours if headache persists or recurs.   topiramate  (TOPAMAX ) 50 MG tablet Take 50 mg by mouth 2 (two) times daily.     Allergies:   Nitrates, organic; Chocolate; and Sulfites   Social History   Socioeconomic History   Marital status: Married    Spouse name: Not on file   Number of children: Not on file   Years of education: Not on file   Highest education level: Not on file  Occupational History   Not on file  Tobacco Use   Smoking status: Never   Smokeless tobacco: Never  Substance and Sexual Activity   Alcohol use: No  Drug use: No   Sexual activity: Not on file  Other Topics Concern   Not on file  Social History Narrative   Not on file   Social Drivers of Health   Financial Resource Strain: Not on file  Food Insecurity: No Food Insecurity (03/08/2024)   Hunger Vital Sign    Worried About Running Out of Food in the Last Year: Never true    Ran Out of Food in the Last Year: Never true  Transportation Needs: No Transportation Needs (03/08/2024)   PRAPARE - Administrator, Civil Service (Medical): No    Lack of Transportation (Non-Medical): No  Physical Activity: Not on file  Stress: Not on file  Social Connections: Not on file     Family History: The patient's family history includes  Cancer (age of onset: 61) in his mother.  ROS:   Please see the history of present illness.     All other systems reviewed and are negative.  EKGs/Labs/Other Studies Reviewed:    The following studies were reviewed today:   EKG:   03/29/2024: Normal sinus rhythm, rate 62  Recent Labs: 03/27/2024: ALT 13; BUN 31; Creatinine 0.99; Hemoglobin 12.6; Platelet Count 124; Potassium 3.8; Sodium 142  Recent Lipid Panel    Component Value Date/Time   CHOL 130 12/03/2016 0457   TRIG 87 12/03/2016 0457   HDL 40 (L) 12/03/2016 0457   CHOLHDL 3.3 12/03/2016 0457   VLDL 17 12/03/2016 0457   LDLCALC 73 12/03/2016 0457    Physical Exam:    VS:  BP 118/78   Pulse 62   Ht 6' (1.829 m)   Wt 161 lb 6.4 oz (73.2 kg)   SpO2 99%   BMI 21.89 kg/m     Wt Readings from Last 3 Encounters:  03/29/24 161 lb 6.4 oz (73.2 kg)  03/27/24 164 lb 1.6 oz (74.4 kg)  03/20/24 171 lb 11.2 oz (77.9 kg)     GEN:  Well nourished, well developed in no acute distress HEENT: Normal NECK: No JVD; No carotid bruits LYMPHATICS: No lymphadenopathy CARDIAC: RRR, no murmurs, rubs, gallops RESPIRATORY:  Clear to auscultation without rales, wheezing or rhonchi  ABDOMEN: Soft, non-tender, non-distended MUSCULOSKELETAL:  No edema; No deformity  SKIN: Warm and dry NEUROLOGIC:  Alert and oriented x 3 PSYCHIATRIC:  Normal affect   ASSESSMENT:    1. Essential hypertension   2. Status post administration of cardiotoxic chemotherapy   3. Deep vein thrombosis (DVT) of left lower extremity, unspecified chronicity, unspecified vein (HCC)   4. Hyperlipidemia, unspecified hyperlipidemia type    PLAN:    Hypertension: On lisinopril 20 mg daily.  Appears controlled  Hyperlipidemia: On pravastatin  40 mg daily.  Check lipid panel  DVT: Reports has had 2 DVTs in left leg.  Continue Xarelto   Anal cancer: Follows with oncology, starting on chemotherapy with 5-FU and mitomycin .  Can be associated with cardiotoxicity, will  check baseline echocardiogram  RTC in 6 months  Medication Adjustments/Labs and Tests Ordered: Current medicines are reviewed at length with the patient today.  Concerns regarding medicines are outlined above.  Orders Placed This Encounter  Procedures   Lipid panel   EKG 12-Lead   ECHOCARDIOGRAM COMPLETE   No orders of the defined types were placed in this encounter.   Patient Instructions  Medication Instructions:  Your physician recommends that you continue on your current medications as directed. Please refer to the Current Medication list given to you today.  *If you  need a refill on your cardiac medications before your next appointment, please call your pharmacy*  Lab Work: Fasting Lipid panel at your convenience.  Testing/Procedures: Your physician has requested that you have an echocardiogram. Echocardiography is a painless test that uses sound waves to create images of your heart. It provides your doctor with information about the size and shape of your heart and how well your heart's chambers and valves are working. This procedure takes approximately one hour. There are no restrictions for this procedure. Please do NOT wear cologne, perfume, aftershave, or lotions (deodorant is allowed). Please arrive 15 minutes prior to your appointment time.  Please note: We ask at that you not bring children with you during ultrasound (echo/ vascular) testing. Due to room size and safety concerns, children are not allowed in the ultrasound rooms during exams. Our front office staff cannot provide observation of children in our lobby area while testing is being conducted. An adult accompanying a patient to their appointment will only be allowed in the ultrasound room at the discretion of the ultrasound technician under special circumstances. We apologize for any inconvenience.   Follow-Up: At [facility], you and your health needs are our priority.  As part of our continuing  mission to provide you with exceptional heart care, our providers are all part of one team.  This team includes your primary Cardiologist (physician) and Advanced Practice Providers or APPs (Physician Assistants and Nurse Practitioners) who all work together to provide you with the care you need, when you need it.  Your next appointment:   6 month(s)  Provider:   Dr. Kate    We recommend signing up for the patient portal called MyChart.  Sign up information is provided on this After Visit Summary.  MyChart is used to connect with patients for Virtual Visits (Telemedicine).  Patients are able to view lab/test results, encounter notes, upcoming appointments, etc.  Non-urgent messages can be sent to your provider as well.   To learn more about what you can do with MyChart, go to [email or web address].          Signed, Lonni LITTIE Kate, MD  03/29/2024 2:23 PM    [facility]

## 2024-03-28 NOTE — Patient Instructions (Addendum)
 Dehydration, Adult Dehydration is a condition in which there is not enough water or other fluids in the body. This happens when a person loses more fluids than they take in. Important organs, such as the kidneys, brain, and heart, cannot function without a proper amount of fluids. Any loss of fluids from the body can lead to dehydration. Dehydration can be mild, moderate, or severe. It should be treated right away to prevent it from becoming severe. What are the causes? Dehydration may be caused by: Health conditions, such as diarrhea, vomiting, fever, infection, or sweating or urinating a lot. Not drinking enough fluids. Certain medicines, such as medicines that remove excess fluid from the body (diuretics). Lack of safe drinking water. Not being able to get enough water and food. What increases the risk? The following factors may make you more likely to develop this condition: Having a long-term (chronic) illness that has not been treated properly, such as diabetes, heart disease, or kidney disease. Being 53 years of age or older. Having a disability. Living in a place that is high in altitude, where thinner, drier air causes more fluid loss. Doing exercises that put stress on your body for a long time (endurance sports). Being active in a hot climate. What are the signs or symptoms? Symptoms of dehydration depend on how severe it is. Mild or moderate dehydration Thirst. Dry lips or dry mouth. Dizziness or light-headedness. Muscle cramps. Dark urine. Urine may be the color of tea. Less urine or tears produced than usual. Headache. Severe dehydration Changes in skin. Your skin may be cold and clammy, blotchy, or pale. Your skin also may not return to normal after being lightly pinched and released. Little or no tears, urine, or sweat. Rapid breathing and low blood pressure. Your pulse may be weak or may be faster than 100 beats per minute when you are sitting still. Other changes,  such as: Feeling very thirsty. Sunken eyes. Cold hands and feet. Confusion. Being very tired (lethargic) or having trouble waking from sleep. Short-term weight loss. Loss of consciousness. How is this diagnosed? This condition is diagnosed based on your symptoms and a physical exam. You may have blood and urine tests to help confirm the diagnosis. How is this treated? Treatment for this condition depends on how severe it is. Treatment should be started right away. Do not wait until dehydration becomes severe. Severe dehydration is an emergency and needs to be treated in a hospital. Mild or moderate dehydration can be treated at home. You may be asked to: Drink more fluids. Drink an oral rehydration solution (ORS). This drink restores fluids, salts, and minerals in the blood (electrolytes). Stop any activities that caused dehydration, such as exercise. Cool off with cool compresses, cool mist, or cool fluids, if heat or too much sweat caused your condition. Take medicine to treat fever, if fever caused your condition. Take medicine to treat nausea and diarrhea, if vomiting or diarrhea caused your condition. Severe dehydration can be treated: With IV fluids. By correcting abnormal levels of electrolytes in your body. By treating the underlying cause of dehydration. Follow these instructions at home: Oral rehydration solution If told by your health care provider, drink an ORS: Make an ORS by following instructions on the package. Start by drinking small amounts, about  cup (120 mL) every 5-10 minutes. Slowly increase how much you drink until you have taken the amount recommended by your health care provider.  Eating and drinking  Drink enough clear fluid to keep  your urine pale yellow. If you were told to drink an ORS, finish the ORS first and then start slowly drinking other clear fluids. Drink fluids such as: Water. Do not drink only water. Doing that can lead to hyponatremia, which  is having too little salt (sodium) in the body. Water from ice chips you suck on. Diluted fruit juice. This is fruit juice that you have added water to. Low-calorie sports drinks. Eat foods that contain a healthy balance of electrolytes, such as bananas, oranges, potatoes, tomatoes, and spinach. Do not drink alcohol. Avoid the following: Drinks that contain a lot of sugar. These include high-calorie sports drinks, fruit juice that is not diluted, and soda. Caffeine. Foods that are greasy or contain a lot of fat or sugar. General instructions Take over-the-counter and prescription medicines only as told by your health care provider. Do not take sodium tablets. Doing that can lead to having too much sodium in the body (hypernatremia). Return to your normal activities as told by your health care provider. Ask your health care provider what activities are safe for you. Keep all follow-up visits. Your health care provider may need to check your progress and suggest new ways to treat your condition. Contact a health care provider if: You have muscle cramps, pain, or discomfort, such as: Pain in your abdomen and the pain gets worse or stays in one area. Stiff neck. You have a rash. You are more irritable than usual. You are sleepier or have a harder time waking. You feel weak or dizzy. You feel very thirsty. Get help right away if: You have symptoms of severe dehydration. You vomit every time you eat or drink. Your vomiting gets worse, does not go away, or includes blood or green matter (bile). You are getting treatment but symptoms are getting worse. You have a fever. You have a severe headache. You have: Diarrhea that gets worse or does not go away. Blood in your stool. This may cause stool to look black and tarry. Not urinating, or urinating only a small amount of very dark urine, within 6-8 hours. You have trouble breathing. These symptoms may be an emergency. Get help right  away. Do not wait to see if the symptoms will go away. Do not drive yourself to the hospital. Call 911. This information is not intended to replace advice given to you by your health care provider. Make sure you discuss any questions you have with your health care provider.  Diarrhea, Adult Diarrhea is frequent loose and sometimes watery bowel movements. Diarrhea can make you feel weak and cause you to become dehydrated. Dehydration is a condition in which there is not enough water or other fluids in the body. Dehydration can make you tired and thirsty, cause you to have a dry mouth, and decrease how often you urinate. Diarrhea typically lasts 2-3 days. However, it can last longer if it is a sign of something more serious. It is important to treat your diarrhea as told by your health care provider. Follow these instructions at home: Eating and drinking     Follow these recommendations as told by your health care provider: Take an oral rehydration solution (ORS). This is an over-the-counter medicine that helps return your body to its normal balance of nutrients and water. It is found at pharmacies and retail stores. Drink enough fluid to keep your urine pale yellow. Drink fluids such as water, diluted fruit juice, and low-calorie sports drinks. You can drink milk also, if desired. Sucking  on ice chips is another way to get fluids. Avoid drinking fluids that contain a lot of sugar or caffeine, such as soda, energy drinks, and regular sports drinks. Avoid alcohol. Eat bland, easy-to-digest foods in small amounts as you are able. These foods include bananas, applesauce, rice, lean meats, toast, and crackers. Avoid spicy or fatty foods.  Medicines Take over-the-counter and prescription medicines only as told by your health care provider. If you were prescribed antibiotics, take them as told by your health care provider. Do not stop using the antibiotic even if you start to feel better. General  instructions  Wash your hands often using soap and water for at least 20 seconds. If soap and water are not available, use hand sanitizer. Others in the household should wash their hands as well. Hands should be washed: After using the toilet or changing a diaper. Before preparing, cooking, or serving food. While caring for a sick person or while visiting someone in a hospital. Rest at home while you recover. Take a warm bath to relieve any burning or pain from frequent diarrhea episodes. Watch your condition for any changes. Contact a health care provider if: You have a fever. Your diarrhea gets worse. You have new symptoms. You vomit every time you eat or drink. You feel light-headed, dizzy, or have a headache. You have muscle cramps. You have signs of dehydration, such as: Dark urine, very little urine, or no urine. Cracked lips. Dry mouth. Sunken eyes. Sleepiness. Weakness. You have bloody or black stools or stools that look like tar. You have severe pain, cramping, or bloating in your abdomen. Your skin feels cold and clammy. You feel confused. Get help right away if: You have chest pain or your heart is beating very quickly. You have trouble breathing or you are breathing very quickly. You feel extremely weak or you faint. These symptoms may be an emergency. Get help right away. Call 911. Do not wait to see if the symptoms will go away. Do not drive yourself to the hospital. This information is not intended to replace advice given to you by your health care provider. Make sure you discuss any questions you have with your health care provider. Document Revised: 01/13/2022 Document Reviewed: 01/13/2022 Elsevier Patient Education  2024 [employer] Inc[employer]  PICC Removal, Adult  A peripherally inserted central catheter (PICC) is a form of IV access that allows medicines and IV fluids to be quickly put into the blood and spread throughout the body. The PICC is a long, thin,  flexible tube (catheter) that is put into a vein in a person's arm or leg. The PICC is guided through the vein until the tip or end of it is in a large vein called the superior vena cava (SVC) just outside the heart. A PICC may be removed if it is no longer needed, if it causes infection, or if it is not working properly. Taking out a PICC is usually a painless procedure. Only a trained health care provider should do it. Do not try to remove a PICC yourself. Tell your health care provider about: Any allergies you have. All medicines you are taking, including vitamins, herbs, eye drops, creams, and over-the-counter medicines. Any problems you or family members have had with anesthetic medicines. Any bleeding problems you have. Any surgeries you have had. Any medical conditions you have. Whether you are pregnant or may be pregnant. What are the risks? Generally, this is a safe procedure. However, problems may occur, including: Bleeding. Infection.  A break in the PICC. Vein blockage caused by an air bubble (air embolism). What happens before the procedure? Discuss PICC removal with your health care team. You will need an order from your health care provider to have your PICC removed. Follow instructions from your health care provider about eating or drinking restrictions. In most cases, special preparation is not needed. Ask your health care provider about: Changing or stopping your regular medicines. This is especially important if you are taking diabetes medicines or blood thinners. Taking medicines such as aspirin  and ibuprofen. These medicines can thin your blood. Do not take these medicines unless your health care provider tells you to take them. Taking over-the-counter medicines, vitamins, herbs, and supplements. Plan to have a responsible adult care for you for the time you are told after you leave the hospital or clinic. This is important. What happens during the procedure? You will lie  down flat. Your head may be positioned slightly lower than your heart. You may be told to keep as still as possible. The bandage (dressing) over your PICC will be removed. The place where the PICC exits the body (exit site) will be cleaned with a germ-killing (antiseptic) solution. A germ-free (sterile) gauze pad will be held gently over the exit site. Your health care provider will pull out the PICC slowly and steadily. You may be asked to hold your breath or exhale while this is done. After the PICC is removed, the health care provider will gently press on the exit site for about 5 minutes. Antibiotic or petroleum-based ointment will be put on the exit site. The exit site will be covered with an airtight (occlusive) sterile dressing or another type of dressing. The PICC will be closely checked to make sure that the entire tube has been removed. Part of the PICC may be tested for bacteria. The procedure may vary among health care providers and hospitals. What happens after the procedure? You may need to stay lying down for a period of time. You will be monitored to make sure that: No blood or fluid is draining from the exit site. There are no signs of an air embolism. Summary A PICC may be removed if it is no longer needed, if it causes infection, or if it is not working properly. Only a trained health care provider should do this procedure. Do not try to remove a PICC yourself. Generally, this is a safe procedure. However, problems may occur, including bleeding, infection, or an air bubble (air embolism). After the procedure, you will be monitored to make sure that no blood or fluid is draining from the site and that there are no signs of an air embolism. This information is not intended to replace advice given to you by your health care provider. Make sure you discuss any questions you have with your health care provider. Document Revised: 02/12/2021 Document Reviewed: 02/12/2021 Elsevier  Patient Education  2024 Elsevier Inc. Document Revised: 02/23/2022 Document Reviewed: 02/23/2022 Elsevier Patient Education  2024 [employer].

## 2024-03-28 NOTE — Addendum Note (Signed)
 Addended by: [name] on: 03/28/2024 07:40 AM   Modules accepted: Orders

## 2024-03-28 NOTE — Progress Notes (Signed)
 Order for PICC removal placed by MD. Patient placed in supine position and procedure explained. Dressing removed and PICC line pulled per institutional policy/procedure. Sterile, occlusive dressing applied to site. Direct pressure held. No bleeding noted. Patient observed for 30 minutes following procedure. Discharge instructions reviewed with patient and spouse. All questions answered.

## 2024-03-29 ENCOUNTER — Ambulatory Visit
Admission: RE | Admit: 2024-03-29 | Discharge: 2024-03-29 | Disposition: A | Discharge status: Home / Self Care | Source: Ambulatory Visit | Attending: Radiation Oncology

## 2024-03-29 ENCOUNTER — Encounter: Payer: Self-pay | Admitting: Cardiology

## 2024-03-29 ENCOUNTER — Encounter: Payer: Self-pay | Admitting: Hematology

## 2024-03-29 ENCOUNTER — Ambulatory Visit: Attending: Cardiology | Admitting: Cardiology

## 2024-03-29 ENCOUNTER — Other Ambulatory Visit: Payer: Self-pay

## 2024-03-29 ENCOUNTER — Telehealth (INDEPENDENT_AMBULATORY_CARE_PROVIDER_SITE_OTHER): Payer: Self-pay | Admitting: Otolaryngology

## 2024-03-29 ENCOUNTER — Other Ambulatory Visit (HOSPITAL_COMMUNITY): Payer: Self-pay

## 2024-03-29 VITALS — BP 118/78 | HR 62 | Ht 72.0 in | Wt 161.4 lb

## 2024-03-29 DIAGNOSIS — I10 Essential (primary) hypertension: Secondary | ICD-10-CM

## 2024-03-29 DIAGNOSIS — E785 Hyperlipidemia, unspecified: Secondary | ICD-10-CM

## 2024-03-29 DIAGNOSIS — I82402 Acute embolism and thrombosis of unspecified deep veins of left lower extremity: Secondary | ICD-10-CM | POA: Diagnosis not present

## 2024-03-29 DIAGNOSIS — Z9221 Personal history of antineoplastic chemotherapy: Secondary | ICD-10-CM | POA: Diagnosis not present

## 2024-03-29 DIAGNOSIS — C210 Malignant neoplasm of anus, unspecified: Secondary | ICD-10-CM | POA: Diagnosis not present

## 2024-03-29 LAB — RAD ONC ARIA SESSION SUMMARY
Course Elapsed Days: 9
Plan Fractions Treated to Date: 8
Plan Prescribed Dose Per Fraction: 1.8 Gy
Plan Total Fractions Prescribed: 30
Plan Total Prescribed Dose: 54 Gy
Reference Point Dosage Given to Date: 14.4 Gy
Reference Point Session Dosage Given: 1.8 Gy
Session Number: 8

## 2024-03-29 NOTE — Telephone Encounter (Signed)
 The patient's spouse called in letting me know that the Doctor wants to see the patient back in the office.  I am unsure of the timing and description for the appointment. Please advise

## 2024-03-29 NOTE — Patient Instructions (Addendum)
 Medication Instructions:  Your physician recommends that you continue on your current medications as directed. Please refer to the Current Medication list given to you today.  *If you need a refill on your cardiac medications before your next appointment, please call your pharmacy*  Lab Work: Fasting Lipid panel at your convenience.  Testing/Procedures: Your physician has requested that you have an echocardiogram. Echocardiography is a painless test that uses sound waves to create images of your heart. It provides your doctor with information about the size and shape of your heart and how well your heart's chambers and valves are working. This procedure takes approximately one hour. There are no restrictions for this procedure. Please do NOT wear cologne, perfume, aftershave, or lotions (deodorant is allowed). Please arrive 15 minutes prior to your appointment time.  Please note: We ask at that you not bring children with you during ultrasound (echo/ vascular) testing. Due to room size and safety concerns, children are not allowed in the ultrasound rooms during exams. Our front office staff cannot provide observation of children in our lobby area while testing is being conducted. An adult accompanying a patient to their appointment will only be allowed in the ultrasound room at the discretion of the ultrasound technician under special circumstances. We apologize for any inconvenience.   Follow-Up: At [facility], you and your health needs are our priority.  As part of our continuing mission to provide you with exceptional heart care, our providers are all part of one team.  This team includes your primary Cardiologist (physician) and Advanced Practice Providers or APPs (Physician Assistants and Nurse Practitioners) who all work together to provide you with the care you need, when you need it.  Your next appointment:   6 month(s)  Provider:   Dr. Kate    We recommend signing up  for the patient portal called MyChart.  Sign up information is provided on this After Visit Summary.  MyChart is used to connect with patients for Virtual Visits (Telemedicine).  Patients are able to view lab/test results, encounter notes, upcoming appointments, etc.  Non-urgent messages can be sent to your provider as well.   To learn more about what you can do with MyChart, go to [email or web address].

## 2024-03-30 ENCOUNTER — Other Ambulatory Visit (HOSPITAL_COMMUNITY): Payer: Self-pay

## 2024-03-30 ENCOUNTER — Telehealth: Payer: Self-pay | Admitting: Cardiology

## 2024-03-30 ENCOUNTER — Other Ambulatory Visit: Payer: Self-pay

## 2024-03-30 ENCOUNTER — Ambulatory Visit
Admission: RE | Admit: 2024-03-30 | Discharge: 2024-03-30 | Disposition: A | Discharge status: Home / Self Care | Source: Ambulatory Visit | Attending: Radiation Oncology

## 2024-03-30 DIAGNOSIS — C210 Malignant neoplasm of anus, unspecified: Secondary | ICD-10-CM | POA: Diagnosis not present

## 2024-03-30 LAB — RAD ONC ARIA SESSION SUMMARY
Course Elapsed Days: 10
Plan Fractions Treated to Date: 9
Plan Prescribed Dose Per Fraction: 1.8 Gy
Plan Total Fractions Prescribed: 30
Plan Total Prescribed Dose: 54 Gy
Reference Point Dosage Given to Date: 16.2 Gy
Reference Point Session Dosage Given: 1.8 Gy
Session Number: 9

## 2024-03-30 NOTE — Telephone Encounter (Signed)
 Patient came in to drop off blood reading for Dr Kate 03/30/2024 @12 :43pm. Paperwork placed in mail box.

## 2024-03-31 ENCOUNTER — Other Ambulatory Visit: Payer: Self-pay

## 2024-03-31 ENCOUNTER — Ambulatory Visit
Admission: RE | Admit: 2024-03-31 | Discharge: 2024-03-31 | Disposition: A | Discharge status: Home / Self Care | Source: Ambulatory Visit | Attending: Radiation Oncology | Admitting: Radiation Oncology

## 2024-03-31 ENCOUNTER — Inpatient Hospital Stay

## 2024-03-31 ENCOUNTER — Inpatient Hospital Stay: Admitting: Nutrition

## 2024-03-31 ENCOUNTER — Inpatient Hospital Stay: Admitting: Physician Assistant

## 2024-03-31 VITALS — BP 123/73 | HR 80 | Temp 97.7°F | Resp 18

## 2024-03-31 DIAGNOSIS — C210 Malignant neoplasm of anus, unspecified: Secondary | ICD-10-CM | POA: Diagnosis not present

## 2024-03-31 DIAGNOSIS — K1230 Oral mucositis (ulcerative), unspecified: Secondary | ICD-10-CM

## 2024-03-31 DIAGNOSIS — D61818 Other pancytopenia: Secondary | ICD-10-CM

## 2024-03-31 DIAGNOSIS — R197 Diarrhea, unspecified: Secondary | ICD-10-CM | POA: Diagnosis not present

## 2024-03-31 LAB — CBC WITH DIFFERENTIAL (CANCER CENTER ONLY)
Abs Immature Granulocytes: 0.01 K/uL (ref 0.00–0.07)
Basophils Absolute: 0 K/uL (ref 0.0–0.1)
Basophils Relative: 1 %
Eosinophils Absolute: 0 K/uL (ref 0.0–0.5)
Eosinophils Relative: 1 %
HCT: 37.8 % — ABNORMAL LOW (ref 39.0–52.0)
Hemoglobin: 12.8 g/dL — ABNORMAL LOW (ref 13.0–17.0)
Immature Granulocytes: 1 %
Lymphocytes Relative: 14 %
Lymphs Abs: 0.2 K/uL — ABNORMAL LOW (ref 0.7–4.0)
MCH: 33.3 pg (ref 26.0–34.0)
MCHC: 33.9 g/dL (ref 30.0–36.0)
MCV: 98.4 fL (ref 80.0–100.0)
Monocytes Absolute: 0.2 K/uL (ref 0.1–1.0)
Monocytes Relative: 13 %
Neutro Abs: 1 K/uL — ABNORMAL LOW (ref 1.7–7.7)
Neutrophils Relative %: 70 %
Platelet Count: 54 K/uL — ABNORMAL LOW (ref 150–400)
RBC: 3.84 MIL/uL — ABNORMAL LOW (ref 4.22–5.81)
RDW: 12.9 % (ref 11.5–15.5)
WBC Count: 1.5 K/uL — ABNORMAL LOW (ref 4.0–10.5)
nRBC: 0 % (ref 0.0–0.2)

## 2024-03-31 LAB — CMP (CANCER CENTER ONLY)
ALT: 21 U/L (ref 0–44)
AST: 19 U/L (ref 15–41)
Albumin: 3.8 g/dL (ref 3.5–5.0)
Alkaline Phosphatase: 69 U/L (ref 38–126)
Anion gap: 7 (ref 5–15)
BUN: 29 mg/dL — ABNORMAL HIGH (ref 8–23)
CO2: 23 mmol/L (ref 22–32)
Calcium: 8.9 mg/dL (ref 8.9–10.3)
Chloride: 114 mmol/L — ABNORMAL HIGH (ref 98–111)
Creatinine: 0.98 mg/dL (ref 0.61–1.24)
GFR, Estimated: >60 mL/min (ref >60)
Glucose, Bld: 100 mg/dL — ABNORMAL HIGH (ref 70–99)
Potassium: 3.6 mmol/L (ref 3.5–5.1)
Sodium: 144 mmol/L (ref 135–145)
Total Bilirubin: 0.9 mg/dL (ref 0.0–1.2)
Total Protein: 6.8 g/dL (ref 6.5–8.1)

## 2024-03-31 LAB — RAD ONC ARIA SESSION SUMMARY
Course Elapsed Days: 11
Plan Fractions Treated to Date: 10
Plan Prescribed Dose Per Fraction: 1.8 Gy
Plan Total Fractions Prescribed: 30
Plan Total Prescribed Dose: 54 Gy
Reference Point Dosage Given to Date: 18 Gy
Reference Point Session Dosage Given: 1.8 Gy
Session Number: 10

## 2024-03-31 LAB — MAGNESIUM: Magnesium: 2 mg/dL (ref 1.7–2.4)

## 2024-03-31 MED ORDER — SODIUM CHLORIDE 0.9 % IV SOLN: Freq: Once | INTRAVENOUS | Status: AC

## 2024-03-31 MED ORDER — SUCRALFATE 1 G PO TABS: ORAL_TABLET | ORAL | 0 refills | Status: DC

## 2024-03-31 MED ORDER — DIPHENOXYLATE-ATROPINE 2.5-0.025 MG PO TABS: 1.0000 | ORAL_TABLET | Freq: Four times a day (QID) | ORAL | 0 refills | Status: DC | PRN

## 2024-03-31 NOTE — Progress Notes (Signed)
 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 Lanny and Dr. Dewey. Receiving concurrent chemoradiation with Mitomycin  and 5 FU started on August 11.  Final radiation therapy is scheduled for September 22.  PMH includes DVT, Hyperlipidemia, HTN, Seizures and Throat cancer (2018).  Medications include Vitamin B 12, Synthroid , MMW, Medrol  Dosepak, Prilosec, Zofran , Compazine   Labs on August 18 include Na 142, K 3.8, Glucose 98, BUN 31, Creatinine 0.99  Height: 6 Weight: 161 pounds 6.4 oz on August 20 UBW: 175-180 pounds per patient BMI: 21.89  State appetite is good and he is hungry. He wants to eat but it is too painful. Rates throat pain as a 7 today and describes it as burning. Has thickened mucus. Reports has been spitting up blood from his throat. Painful swallow limiting oral intake to one Ensure, 12 oz flat Dr. Nunzio, and 8 oz water daily.  He was using a lot of salt mixed with small amount of water to gargle. He believes this caused his significant throat pain. Patient saw ENT who evaluated throat and confirmed it was raw and prescribed antibiotics and steroids.  Reports up to 10 loose, watery stools daily and as many as 20. (Due to antibiotics and or chemotherapy) He tried Imodium but states it didn't help.  He is not drinking any milk or consuming dairy products.  States blood pressure was low at radiation oncology visit.  He admits to having fatigue and being lightheaded.  Nutrition diagnosis:  Unintended weight loss related to cancer and associated treatments as evidenced by 11% weight loss in less than 3 months which is clinically significant  Intervention: Education provided on importance of sipping on fluids throughout the day.   Once diet able to be advanced, encourage small frequent meals/snacks with high-calorie, high-protein foods. Reviewed foods that will help thicken stool.  Brief education provided on low fiber diet for continuing diarrhea. Provided  various samples of Unjury protein powder, boost very high-calorie, Ensure complete, [employer] 1.4 and Banatrol. Provided nutrition fact sheets and contact information. Contacted providers who will see patient in [facility] to provide fluids and get additional labs/evaluate for additional medications as needed.  Next visit: Scheduled for Monday, September 8 during infusion with Joli.  **Disclaimer: This note was dictated with voice recognition software. Similar sounding words can inadvertently be transcribed and this note may contain transcription errors which may not have been corrected upon publication of note.**

## 2024-03-31 NOTE — Patient Instructions (Signed)

## 2024-03-31 NOTE — Patient Instructions (Addendum)
 Stop taking the Bentyl (Dicyclomine) for diarrhea.   Prescription sent to your pharmacy for Lomotil . This is to help with diarrhea.  You can swish and swallow the Magic Mouthwash. This should help with your sore throat.  We will see you Monday after radiation to recheck your symptoms and give IV fluids if needed. If you are feeling better you can cancel the appointment.

## 2024-03-31 NOTE — Progress Notes (Signed)
 Symptom Management Consult Note [facility]    [name], MD as PCP - General (Hematology) [name], [name], RN as Oncology Nurse Navigator    Name / MRN / DOB: Derek Booker  [id number]  07/06/1946   Date of visit: 03/31/2024   Chief Complaint/Reason for visit: sore throat   Current Therapy: Mitomycin  and 5FU  Last treatment:  Day 1   Cycle 1 on 03/20/24    ASSESSMENT AND PLAN Patient is a 78 y.o. male with oncologic history of anal cancer followed by Dr. Lanny.  I have viewed most recent oncology note and lab work.  #Anal cancer  - Next appointment with oncologist is 04/04/24  #Mucositis - 2/2 chemotherapy. Grade 2-3 on exam. - Encouraged patient to swish and swallow magic mouthwash to help with sore throat. He has only been swishing and spitting at home. No refill needed at this time. Prescription sent to pharmacy for Carafate  to dissolve and drink as well.  #Diarrhea - CMP and magnesium are overall unremarkable. Benign abdominal exam. - Patient received 1L NS for hydration support. - Patient will stop taking Bentyl as it has not help and try Lomotil . Prescription sent to pharmacy.  #Pancytopenia - Labs today showing WBC 1.5, Hgb 12.8, ANC 1.0.  - No fevers. Discussed neutropenic precautions.  Will have patient RTC Monday to recheck symptoms. Strict ED precautions discussed should symptoms worsen.   HEME/ONC HISTORY Oncology History  Anal cancer (HCC)  03/07/2024 Initial Diagnosis   Anal cancer (HCC)   03/07/2024 Cancer Staging   Staging form: Anus, AJCC V9 - Clinical: Stage IIIA (cT3, cN0, cM0) - Signed by [name], MD on 03/20/2024   03/20/2024 -  Chemotherapy   Patient is on Treatment Plan : ANUS Mitomycin  D1,28 + 5FU D1-4, 28-31 q32d         INTERVAL HISTORY  Discussed the use of AI scribe software for clinical note transcription with the patient, who gave verbal consent to proceed.    Derek Booker is a 78 y.o.  male with oncologic history as above presenting to [facility] today with chief complaint of sore throat. Patient is accompanied by family member who provides additional history.  He has been experiencing a sore throat for approximately ten days, with significant pain in his mouth and tongue. The pain has progressively worsened, making it difficult for him to eat or drink. He has been using 'magic mouthwash' at home, which provides temporary relief for a couple of hours. He spits out the mouthwash after use.  In terms of oral intake, he has been able to consume a Boost, some water, flat Dr. Nunzio, and a little Gatorlite. He estimates his fluid intake to be around 35 to 40 ounces per day. His wife prepares a cup of broth for him, and he managed to eat half a cup of instant mashed potatoes. Warm foods are more tolerable than cold ones.  He is experiencing diarrhea, with five to six episodes per day, described as complete liquid and green-brown in color. No abdominal pain is present. He is taking Bentyl four times a day for diarrhea, but it has not been effective. No fevers have been reported at home.    ROS  All other systems are reviewed and are negative for acute change except as noted in the HPI.    Allergies  Allergen Reactions   Nitrates, Organic Other (See Comments)    headaches   Chocolate Other (See Comments)  Sulfites Other (See Comments)     Past Medical History:  Diagnosis Date   DVT (deep venous thrombosis) (HCC)    Hyperlipidemia    Hypertension    Seizures (HCC)    Throat cancer (HCC)      Past Surgical History:  Procedure Laterality Date   CHOLECYSTECTOMY     ORTHOPEDIC SURGERY      Social History   Socioeconomic History   Marital status: Married    Spouse name: Not on file   Number of children: Not on file   Years of education: Not on file   Highest education level: Not on file  Occupational History   Not on file  Tobacco Use   Smoking status: Never    Smokeless tobacco: Never  Substance and Sexual Activity   Alcohol use: No   Drug use: No   Sexual activity: Not on file  Other Topics Concern   Not on file  Social History Narrative   Not on file   Social Drivers of Health   Financial Resource Strain: Not on file  Food Insecurity: No Food Insecurity (03/08/2024)   Hunger Vital Sign    Worried About Running Out of Food in the Last Year: Never true    Ran Out of Food in the Last Year: Never true  Transportation Needs: No Transportation Needs (03/08/2024)   PRAPARE - Administrator, Civil Service (Medical): No    Lack of Transportation (Non-Medical): No  Physical Activity: Not on file  Stress: Not on file  Social Connections: Not on file  Intimate Partner Violence: Not At Risk (03/08/2024)   Humiliation, Afraid, Rape, and Kick questionnaire    Fear of Current or Ex-Partner: No    Emotionally Abused: No    Physically Abused: No    Sexually Abused: No    Family History  Problem Relation Age of Onset   Cancer Mother 71       breast cancer     Current Outpatient Medications:    diphenoxylate -atropine  (LOMOTIL ) 2.5-0.025 MG tablet, Take 1 tablet by mouth every 6 (six) hours as needed for diarrhea or loose stools., Disp: 30 tablet, Rfl: 0   sucralfate  (CARAFATE ) 1 g tablet, Dissolve tablet in 8 oz pf water and drink., Disp: 28 tablet, Rfl: 0   cyanocobalamin (VITAMIN B12) 1000 MCG tablet, Take 1,000 mcg by mouth daily., Disp: , Rfl:    donepezil (ARICEPT) 10 MG tablet, Take 10 mg by mouth daily., Disp: , Rfl:    gabapentin (NEURONTIN) 100 MG capsule, Take 100 mg by mouth 3 (three) times daily., Disp: , Rfl:    levothyroxine  (SYNTHROID , LEVOTHROID) 112 MCG tablet, Take 112 mcg by mouth daily before breakfast., Disp: , Rfl:    lisinopril (ZESTRIL) 20 MG tablet, Take 20 mg by mouth daily., Disp: , Rfl:    magic mouthwash (nystatin , lidocaine , diphenhydrAMINE, alum & mag hydroxide) suspension, Take 5 mLs by mouth 4 (four)  times daily as needed for mouth pain. Swish and Swallow, Disp: 140 mL, Rfl: 1   methylPREDNISolone  (MEDROL  DOSEPAK) 4 MG TBPK tablet, Take with signs of chronic sinusitis and take as directed, Disp: 1 each, Rfl: 1   mirabegron ER (MYRBETRIQ) 50 MG TB24 tablet, Take 50 mg by mouth daily., Disp: , Rfl:    omeprazole (PRILOSEC) 20 MG capsule, Take 20 mg by mouth daily., Disp: , Rfl:    ondansetron  (ZOFRAN ) 8 MG tablet, Take 1 tablet (8 mg total) by mouth every 8 (eight) hours  as needed for nausea or vomiting., Disp: 30 tablet, Rfl: 2   pravastatin  (PRAVACHOL ) 40 MG tablet, Take 40 mg by mouth daily., Disp: , Rfl:    prochlorperazine  (COMPAZINE ) 10 MG tablet, Take 1 tablet (10 mg total) by mouth every 6 (six) hours as needed for nausea or vomiting., Disp: 30 tablet, Rfl: 1   rivaroxaban  (XARELTO ) 20 MG TABS tablet, Take 20 mg by mouth daily., Disp: , Rfl:    sertraline  (ZOLOFT ) 50 MG tablet, Take 50 mg by mouth daily., Disp: , Rfl:    solifenacin (VESICARE) 5 MG tablet, Take 5 mg by mouth daily., Disp: , Rfl:    SUMAtriptan  (IMITREX ) 100 MG tablet, Take 100 mg by mouth every 2 (two) hours as needed for migraine. May repeat in 2 hours if headache persists or recurs., Disp: , Rfl:    topiramate  (TOPAMAX ) 50 MG tablet, Take 50 mg by mouth 2 (two) times daily., Disp: , Rfl:   PHYSICAL EXAM ECOG FS:1 - Symptomatic but completely ambulatory    Vitals:   03/31/24 1345  BP: 123/73  Pulse: 80  Resp: 18  Temp: 97.7 F (36.5 C)  TempSrc: Oral  SpO2: 100%   Physical Exam Vitals and nursing note reviewed.  Constitutional:      Appearance: He is not ill-appearing or toxic-appearing.  HENT:     Head: Normocephalic.     Mouth/Throat:     Mouth: Mucous membranes are dry.     Comments: Oral mucosa red with some white areas, consistent with mucositis. Eyes:     Conjunctiva/sclera: Conjunctivae normal.  Cardiovascular:     Rate and Rhythm: Normal rate and regular rhythm.     Pulses: Normal pulses.      Heart sounds: Normal heart sounds.  Pulmonary:     Effort: Pulmonary effort is normal.     Breath sounds: Normal breath sounds.  Abdominal:     General: There is no distension.  Musculoskeletal:     Cervical back: Normal range of motion.  Skin:    General: Skin is warm and dry.  Neurological:     Mental Status: He is alert.        LABORATORY DATA I have reviewed the data as listed    Latest Ref Rng & Units 03/31/2024    1:16 PM 03/27/2024    3:32 PM 03/20/2024   12:10 PM  CBC  WBC 4.0 - 10.5 K/uL 1.5  3.8  3.8   Hemoglobin 13.0 - 17.0 g/dL 87.1  87.3  87.3   Hematocrit 39.0 - 52.0 % 37.8  38.7  37.6   Platelets 150 - 400 K/uL 54  124  182         Latest Ref Rng & Units 03/31/2024    1:16 PM 03/27/2024    3:32 PM 03/20/2024   12:10 PM  CMP  Glucose 70 - 99 mg/dL 899  98  84   BUN 8 - 23 mg/dL 29  31  22    Creatinine 0.61 - 1.24 mg/dL 9.01  9.00  8.96   Sodium 135 - 145 mmol/L 144  142  138   Potassium 3.5 - 5.1 mmol/L 3.6  3.8  3.9   Chloride 98 - 111 mmol/L 114  114  109   CO2 22 - 32 mmol/L 23  23  25    Calcium 8.9 - 10.3 mg/dL 8.9  9.1  8.9   Total Protein 6.5 - 8.1 g/dL 6.8  7.0  6.7   Total  Bilirubin 0.0 - 1.2 mg/dL 0.9  1.1  0.6   Alkaline Phos 38 - 126 U/L 69  72  79   AST 15 - 41 U/L 19  15  19    ALT 0 - 44 U/L 21  13  15         RADIOGRAPHIC STUDIES (from last 24 hours if applicable) I have personally reviewed the radiological images as listed and agreed with the findings in the report. No results found.      Visit Diagnosis: 1. Anal cancer (HCC)   2. Diarrhea, unspecified type   3. Other pancytopenia (HCC)   4. Mucositis oral      Orders Placed This Encounter  Procedures   CBC with Differential (Cancer Center Only)    Standing Status:   Future    Number of Occurrences:   1    Expected Date:   03/31/2024    Expiration Date:   03/31/2025   CMP (Cancer Center only)    Standing Status:   Future    Number of Occurrences:   1    Expected  Date:   03/31/2024    Expiration Date:   03/31/2025   Magnesium    Standing Status:   Future    Number of Occurrences:   1    Expected Date:   03/31/2024    Expiration Date:   03/31/2025    All questions were answered. The patient knows to call the clinic with any problems, questions or concerns. No barriers to learning was detected.  A total of more than 30 minutes were spent on this encounter with face-to-face time and non-face-to-face time, including preparing to see the patient, ordering tests and/or medications, counseling the patient and coordination of care as outlined above.    Thank you for allowing me to participate in the care of this patient.    Lelan Cush E  Walisiewicz, PA-C Department of Hematology/Oncology [facility] at [facility] Phone: [phone]  Fax:(336) [phone]    03/31/2024 3:27 PM

## 2024-04-01 ENCOUNTER — Encounter: Payer: Self-pay | Admitting: Hematology

## 2024-04-02 NOTE — Progress Notes (Unsigned)
 [facility]   Telephone:(336) [phone] Fax:(336) [phone]    [name], MD as PCP - General (Hematology) [name], [name], RN as Oncology Nurse Navigator   CHIEF COMPLAINT: Follow up anal cancer   Oncology History  Anal cancer [facility])  03/07/2024 Initial Diagnosis   Anal cancer (HCC)   03/07/2024 Cancer Staging   Staging form: Anus, AJCC V9 - Clinical: Stage IIIA (cT3, cN0, cM0) - Signed by [name], MD on 03/20/2024   03/20/2024 -  Chemotherapy   Patient is on Treatment Plan : ANUS Mitomycin  D1,28 + 5FU D1-4, 28-31 q32d        CURRENT THERAPY: Concurrent chemoRT with 5FU/mitomycin  on weeks 1 and 5, starting 03/20/24  INTERVAL HISTORY Mr. Enyeart returns for follow up. Seen 8/22 with mucositis and diarrhea, received IVF  ROS   Past Medical History:  Diagnosis Date   DVT (deep venous thrombosis) (HCC)    Hyperlipidemia    Hypertension    Seizures (HCC)    Throat cancer [facility])      Past Surgical History:  Procedure Laterality Date   CHOLECYSTECTOMY     ORTHOPEDIC SURGERY       Outpatient Encounter Medications as of 04/04/2024  Medication Sig   cyanocobalamin (VITAMIN B12) 1000 MCG tablet Take 1,000 mcg by mouth daily.   diphenoxylate -atropine  (LOMOTIL ) 2.5-0.025 MG tablet Take 1 tablet by mouth every 6 (six) hours as needed for diarrhea or loose stools.   donepezil (ARICEPT) 10 MG tablet Take 10 mg by mouth daily.   gabapentin (NEURONTIN) 100 MG capsule Take 100 mg by mouth 3 (three) times daily.   levothyroxine  (SYNTHROID , LEVOTHROID) 112 MCG tablet Take 112 mcg by mouth daily before breakfast.   lisinopril (ZESTRIL) 20 MG tablet Take 20 mg by mouth daily.   magic mouthwash (nystatin , lidocaine , diphenhydrAMINE, alum & mag hydroxide) suspension Take 5 mLs by mouth 4 (four) times daily as needed for mouth pain. Swish and Swallow   methylPREDNISolone  (MEDROL  DOSEPAK) 4 MG TBPK tablet Take with signs of chronic sinusitis and take as  directed   mirabegron ER (MYRBETRIQ) 50 MG TB24 tablet Take 50 mg by mouth daily.   omeprazole (PRILOSEC) 20 MG capsule Take 20 mg by mouth daily.   ondansetron  (ZOFRAN ) 8 MG tablet Take 1 tablet (8 mg total) by mouth every 8 (eight) hours as needed for nausea or vomiting.   pravastatin  (PRAVACHOL ) 40 MG tablet Take 40 mg by mouth daily.   prochlorperazine  (COMPAZINE ) 10 MG tablet Take 1 tablet (10 mg total) by mouth every 6 (six) hours as needed for nausea or vomiting.   rivaroxaban  (XARELTO ) 20 MG TABS tablet Take 20 mg by mouth daily.   sertraline  (ZOLOFT ) 50 MG tablet Take 50 mg by mouth daily.   solifenacin (VESICARE) 5 MG tablet Take 5 mg by mouth daily.   sucralfate  (CARAFATE ) 1 g tablet Dissolve tablet in 8 oz pf water and drink.   SUMAtriptan  (IMITREX ) 100 MG tablet Take 100 mg by mouth every 2 (two) hours as needed for migraine. May repeat in 2 hours if headache persists or recurs.   topiramate  (TOPAMAX ) 50 MG tablet Take 50 mg by mouth 2 (two) times daily.   No facility-administered encounter medications on file as of 04/04/2024.     There were no vitals filed for this visit. There is no height or weight on file to calculate BMI.   ECOG PERFORMANCE STATUS: {CHL ONC ECOG ED:[id number]}  PHYSICAL EXAM GENERAL:alert, no distress and comfortable SKIN: no rash  EYES: sclera clear NECK: without mass LYMPH:  no palpable cervical or supraclavicular lymphadenopathy  LUNGS: clear with normal breathing effort HEART: regular rate & rhythm, no lower extremity edema ABDOMEN: abdomen soft, non-tender and normal bowel sounds NEURO: alert & oriented x 3 with fluent speech, no focal motor/sensory deficits Breast exam:  PAC without erythema    CBC    Latest Ref Rng & Units 03/31/2024    1:16 PM 03/27/2024    3:32 PM 03/20/2024   12:10 PM  CBC  WBC 4.0 - 10.5 K/uL 1.5  3.8  3.8   Hemoglobin 13.0 - 17.0 g/dL 87.1  87.3  87.3   Hematocrit 39.0 - 52.0 % 37.8  38.7  37.6   Platelets 150 -  400 K/uL 54  124  182       CMP     Latest Ref Rng & Units 03/31/2024    1:16 PM 03/27/2024    3:32 PM 03/20/2024   12:10 PM  CMP  Glucose 70 - 99 mg/dL 899  98  84   BUN 8 - 23 mg/dL 29  31  22    Creatinine 0.61 - 1.24 mg/dL 9.01  9.00  8.96   Sodium 135 - 145 mmol/L 144  142  138   Potassium 3.5 - 5.1 mmol/L 3.6  3.8  3.9   Chloride 98 - 111 mmol/L 114  114  109   CO2 22 - 32 mmol/L 23  23  25    Calcium 8.9 - 10.3 mg/dL 8.9  9.1  8.9   Total Protein 6.5 - 8.1 g/dL 6.8  7.0  6.7   Total Bilirubin 0.0 - 1.2 mg/dL 0.9  1.1  0.6   Alkaline Phos 38 - 126 U/L 69  72  79   AST 15 - 41 U/L 19  15  19    ALT 0 - 44 U/L 21  13  15        ASSESSMENT & PLAN:  PLAN:  No orders of the defined types were placed in this encounter.     All questions were answered. The patient knows to call the clinic with any problems, questions or concerns. No barriers to learning were detected. I spent *** counseling the patient face to face. The total time spent in the appointment was *** and more than 50% was on counseling, review of test results, and coordination of care.   Skyleigh Windle K Sayvon Arterberry, NP 04/02/2024 10:24 AM

## 2024-04-03 ENCOUNTER — Inpatient Hospital Stay (HOSPITAL_BASED_OUTPATIENT_CLINIC_OR_DEPARTMENT_OTHER): Admitting: Physician Assistant

## 2024-04-03 ENCOUNTER — Other Ambulatory Visit: Payer: Self-pay

## 2024-04-03 ENCOUNTER — Inpatient Hospital Stay

## 2024-04-03 ENCOUNTER — Ambulatory Visit
Admission: RE | Admit: 2024-04-03 | Discharge: 2024-04-03 | Disposition: A | Discharge status: Home / Self Care | Source: Ambulatory Visit | Attending: Radiation Oncology | Admitting: Radiation Oncology

## 2024-04-03 VITALS — BP 118/78 | HR 62 | Temp 97.9°F | Resp 16 | Wt 163.5 lb

## 2024-04-03 DIAGNOSIS — R197 Diarrhea, unspecified: Secondary | ICD-10-CM

## 2024-04-03 DIAGNOSIS — K1230 Oral mucositis (ulcerative), unspecified: Secondary | ICD-10-CM | POA: Diagnosis not present

## 2024-04-03 DIAGNOSIS — C210 Malignant neoplasm of anus, unspecified: Secondary | ICD-10-CM

## 2024-04-03 LAB — RAD ONC ARIA SESSION SUMMARY
Course Elapsed Days: 14
Plan Fractions Treated to Date: 11
Plan Prescribed Dose Per Fraction: 1.8 Gy
Plan Total Fractions Prescribed: 30
Plan Total Prescribed Dose: 54 Gy
Reference Point Dosage Given to Date: 19.8 Gy
Reference Point Session Dosage Given: 1.8 Gy
Session Number: 11

## 2024-04-03 NOTE — Progress Notes (Signed)
 Symptom Management Consult Note [facility]    [name], MD as PCP - General (Hematology) [name], [name], RN as Oncology Nurse Navigator    Name / MRN / DOB: Derek Booker  [id number]  Jun 03, 1946   Date of visit: 04/03/2024   Chief Complaint/Reason for visit: symptom recheck   Current Therapy: Mitomycin  and 5FU   Last treatment:  Day 1   Cycle 1 on 03/20/24    ASSESSMENT AND PLAN Patient is a 78 y.o. male with oncologic history of anal cancer followed by Dr. Lanny.  I have viewed most recent oncology note and lab work.  #Anal cancer - Next appointment with oncologist is 04/04/24   #Oral mucositis  - Improved since last visit. Now Grade 1-2. - Continue Carafate  for oral mucositis. - Use Magic Mouthwash if Carafate  becomes less effective.  #Diarrhea secondary to treatment Currently  controlled with Lomotil ; Imodium and Bentyl previously was ineffective.  - Use Lomotil  as needed for diarrhea, not scheduled to prevent constipation. - Consider stool softener if stools become hard with goal to avoid constipation - Patient declines need for IVF today. Will not repeat labs as symptoms improved.   HEME/ONC HISTORY Oncology History  Anal cancer (HCC)  03/07/2024 Initial Diagnosis   Anal cancer (HCC)   03/07/2024 Cancer Staging   Staging form: Anus, AJCC V9 - Clinical: Stage IIIA (cT3, cN0, cM0) - Signed by [name], MD on 03/20/2024   03/20/2024 -  Chemotherapy   Patient is on Treatment Plan : ANUS Mitomycin  D1,28 + 5FU D1-4, 28-31 q32d         INTERVAL HISTORY  Discussed the use of AI scribe software for clinical note transcription with the patient, who gave verbal consent to proceed.    Derek Booker is a 78 y.o. male with oncologic history as above presenting to [facility] today with chief complaint of recheck symptoms. Accompanied to clinic today by spouse who provides additional history.  He has experienced a significant improvement  in his ability to eat and drink over the weekend, consuming mixed vegetables, salmon, yam, and a banana shake with protein powder. He was able to drink a 24-ounce shake in one sitting, which is a new achievement for him.  He has been using Carafate  and has not used magic mouthwash recently. His throat is not sore currently. No recent diarrhea, but he has been taking Lomotil , one tablet each morning and afternoon, to manage bowel movements. He has not had a bowel movement since Friday, only experiencing gas. He denies any abdominal pain. He is concerned about potential accidents during treatment sessions due to previous issues with bowel control.  He describes a burning sensation on his bottom rom radiation therapy, which is becoming increasingly uncomfortable and makes sitting difficult. He also uses an 'udder ointment' for additional comfort. He is experimenting with different dietary options to find what works best for him.       ROS  All other systems are reviewed and are negative for acute change except as noted in the HPI.    Allergies  Allergen Reactions   Nitrates, Organic Other (See Comments)    headaches   Chocolate Other (See Comments)   Sulfites Other (See Comments)     Past Medical History:  Diagnosis Date   DVT (deep venous thrombosis) (HCC)    Hyperlipidemia    Hypertension    Seizures (HCC)    Throat cancer (HCC)  Past Surgical History:  Procedure Laterality Date   CHOLECYSTECTOMY     ORTHOPEDIC SURGERY      Social History   Socioeconomic History   Marital status: Married    Spouse name: Not on file   Number of children: Not on file   Years of education: Not on file   Highest education level: Not on file  Occupational History   Not on file  Tobacco Use   Smoking status: Never   Smokeless tobacco: Never  Substance and Sexual Activity   Alcohol use: No   Drug use: No   Sexual activity: Not on file  Other Topics Concern   Not on file  Social  History Narrative   Not on file   Social Drivers of Health   Financial Resource Strain: Not on file  Food Insecurity: No Food Insecurity (03/08/2024)   Hunger Vital Sign    Worried About Running Out of Food in the Last Year: Never true    Ran Out of Food in the Last Year: Never true  Transportation Needs: No Transportation Needs (03/08/2024)   PRAPARE - Administrator, Civil Service (Medical): No    Lack of Transportation (Non-Medical): No  Physical Activity: Not on file  Stress: Not on file  Social Connections: Not on file  Intimate Partner Violence: Not At Risk (03/08/2024)   Humiliation, Afraid, Rape, and Kick questionnaire    Fear of Current or Ex-Partner: No    Emotionally Abused: No    Physically Abused: No    Sexually Abused: No    Family History  Problem Relation Age of Onset   Cancer Mother 69       breast cancer     Current Outpatient Medications:    cyanocobalamin (VITAMIN B12) 1000 MCG tablet, Take 1,000 mcg by mouth daily., Disp: , Rfl:    diphenoxylate -atropine  (LOMOTIL ) 2.5-0.025 MG tablet, Take 1 tablet by mouth every 6 (six) hours as needed for diarrhea or loose stools., Disp: 30 tablet, Rfl: 0   donepezil (ARICEPT) 10 MG tablet, Take 10 mg by mouth daily., Disp: , Rfl:    gabapentin (NEURONTIN) 100 MG capsule, Take 100 mg by mouth 3 (three) times daily., Disp: , Rfl:    levothyroxine  (SYNTHROID , LEVOTHROID) 112 MCG tablet, Take 112 mcg by mouth daily before breakfast., Disp: , Rfl:    lisinopril (ZESTRIL) 20 MG tablet, Take 20 mg by mouth daily., Disp: , Rfl:    magic mouthwash (nystatin , lidocaine , diphenhydrAMINE, alum & mag hydroxide) suspension, Take 5 mLs by mouth 4 (four) times daily as needed for mouth pain. Swish and Swallow, Disp: 140 mL, Rfl: 1   methylPREDNISolone  (MEDROL  DOSEPAK) 4 MG TBPK tablet, Take with signs of chronic sinusitis and take as directed, Disp: 1 each, Rfl: 1   mirabegron ER (MYRBETRIQ) 50 MG TB24 tablet, Take 50 mg by  mouth daily., Disp: , Rfl:    omeprazole (PRILOSEC) 20 MG capsule, Take 20 mg by mouth daily., Disp: , Rfl:    ondansetron  (ZOFRAN ) 8 MG tablet, Take 1 tablet (8 mg total) by mouth every 8 (eight) hours as needed for nausea or vomiting., Disp: 30 tablet, Rfl: 2   pravastatin  (PRAVACHOL ) 40 MG tablet, Take 40 mg by mouth daily., Disp: , Rfl:    prochlorperazine  (COMPAZINE ) 10 MG tablet, Take 1 tablet (10 mg total) by mouth every 6 (six) hours as needed for nausea or vomiting., Disp: 30 tablet, Rfl: 1   rivaroxaban  (XARELTO ) 20 MG TABS tablet,  Take 20 mg by mouth daily., Disp: , Rfl:    sertraline  (ZOLOFT ) 50 MG tablet, Take 50 mg by mouth daily., Disp: , Rfl:    solifenacin (VESICARE) 5 MG tablet, Take 5 mg by mouth daily., Disp: , Rfl:    sucralfate  (CARAFATE ) 1 g tablet, Dissolve tablet in 8 oz pf water and drink., Disp: 28 tablet, Rfl: 0   SUMAtriptan  (IMITREX ) 100 MG tablet, Take 100 mg by mouth every 2 (two) hours as needed for migraine. May repeat in 2 hours if headache persists or recurs., Disp: , Rfl:    topiramate  (TOPAMAX ) 50 MG tablet, Take 50 mg by mouth 2 (two) times daily., Disp: , Rfl:   PHYSICAL EXAM ECOG FS:1 - Symptomatic but completely ambulatory    Vitals:   04/03/24 1125 04/03/24 1151  BP: 118/78   Pulse: (!) 48 62  Resp: 16   Temp: 97.9 F (36.6 C)   TempSrc: Oral   SpO2: 100% 97%  Weight: 163 lb 8 oz (74.2 kg)    Physical Exam Vitals and nursing note reviewed.  Constitutional:      Appearance: He is not ill-appearing or toxic-appearing.  HENT:     Head: Normocephalic.     Mouth/Throat:     Pharynx: No posterior oropharyngeal erythema.     Comments: Small white plaque on left buccal mucosa. Eyes:     Conjunctiva/sclera: Conjunctivae normal.  Cardiovascular:     Rate and Rhythm: Normal rate and regular rhythm.     Pulses: Normal pulses.     Heart sounds: Normal heart sounds.  Pulmonary:     Effort: Pulmonary effort is normal.     Breath sounds: Normal  breath sounds.  Abdominal:     General: There is no distension.  Musculoskeletal:     Cervical back: Normal range of motion.  Skin:    General: Skin is warm and dry.  Neurological:     Mental Status: He is alert.        LABORATORY DATA I have reviewed the data as listed    Latest Ref Rng & Units 03/31/2024    1:16 PM 03/27/2024    3:32 PM 03/20/2024   12:10 PM  CBC  WBC 4.0 - 10.5 K/uL 1.5  3.8  3.8   Hemoglobin 13.0 - 17.0 g/dL 87.1  87.3  87.3   Hematocrit 39.0 - 52.0 % 37.8  38.7  37.6   Platelets 150 - 400 K/uL 54  124  182         Latest Ref Rng & Units 03/31/2024    1:16 PM 03/27/2024    3:32 PM 03/20/2024   12:10 PM  CMP  Glucose 70 - 99 mg/dL 899  98  84   BUN 8 - 23 mg/dL 29  31  22    Creatinine 0.61 - 1.24 mg/dL 9.01  9.00  8.96   Sodium 135 - 145 mmol/L 144  142  138   Potassium 3.5 - 5.1 mmol/L 3.6  3.8  3.9   Chloride 98 - 111 mmol/L 114  114  109   CO2 22 - 32 mmol/L 23  23  25    Calcium 8.9 - 10.3 mg/dL 8.9  9.1  8.9   Total Protein 6.5 - 8.1 g/dL 6.8  7.0  6.7   Total Bilirubin 0.0 - 1.2 mg/dL 0.9  1.1  0.6   Alkaline Phos 38 - 126 U/L 69  72  79   AST 15 - 41 U/L  19  15  19    ALT 0 - 44 U/L 21  13  15         RADIOGRAPHIC STUDIES (from last 24 hours if applicable) I have personally reviewed the radiological images as listed and agreed with the findings in the report. No results found.      Visit Diagnosis: 1. Anal cancer (HCC)   2. Diarrhea, unspecified type   3. Mucositis oral      No orders of the defined types were placed in this encounter.   All questions were answered. The patient knows to call the clinic with any problems, questions or concerns. No barriers to learning was detected.  A total of more than 20 minutes were spent on this encounter with face-to-face time and non-face-to-face time, including preparing to see the patient, ordering tests and/or medications, counseling the patient and coordination of care as outlined  above.    Thank you for allowing me to participate in the care of this patient.    Vannah Nadal E  Walisiewicz, PA-C Department of Hematology/Oncology [facility] at [facility] Phone: [phone]  [phone]    04/03/2024 12:14 PM

## 2024-04-04 ENCOUNTER — Inpatient Hospital Stay: Admitting: Nurse Practitioner

## 2024-04-04 ENCOUNTER — Ambulatory Visit
Admission: RE | Admit: 2024-04-04 | Discharge: 2024-04-04 | Disposition: A | Discharge status: Home / Self Care | Source: Ambulatory Visit | Attending: Radiation Oncology

## 2024-04-04 ENCOUNTER — Encounter: Payer: Self-pay | Admitting: Hematology

## 2024-04-04 ENCOUNTER — Inpatient Hospital Stay

## 2024-04-04 ENCOUNTER — Other Ambulatory Visit: Payer: Self-pay

## 2024-04-04 ENCOUNTER — Encounter: Payer: Self-pay | Admitting: Nurse Practitioner

## 2024-04-04 ENCOUNTER — Other Ambulatory Visit: Payer: Self-pay | Admitting: Radiation Oncology

## 2024-04-04 ENCOUNTER — Ambulatory Visit: Payer: Self-pay | Admitting: Nurse Practitioner

## 2024-04-04 ENCOUNTER — Telehealth: Payer: Self-pay | Admitting: Radiation Oncology

## 2024-04-04 VITALS — BP 102/58 | HR 72 | Temp 97.8°F | Resp 17 | Wt 162.3 lb

## 2024-04-04 DIAGNOSIS — C210 Malignant neoplasm of anus, unspecified: Secondary | ICD-10-CM

## 2024-04-04 LAB — CBC WITH DIFFERENTIAL/PLATELET
Abs Immature Granulocytes: 0.01 K/uL (ref 0.00–0.07)
Basophils Absolute: 0 K/uL (ref 0.0–0.1)
Basophils Relative: 1 %
Eosinophils Absolute: 0 K/uL (ref 0.0–0.5)
Eosinophils Relative: 2 %
HCT: 34.1 % — ABNORMAL LOW (ref 39.0–52.0)
Hemoglobin: 11.5 g/dL — ABNORMAL LOW (ref 13.0–17.0)
Immature Granulocytes: 1 %
Lymphocytes Relative: 16 %
Lymphs Abs: 0.2 K/uL — ABNORMAL LOW (ref 0.7–4.0)
MCH: 33 pg (ref 26.0–34.0)
MCHC: 33.7 g/dL (ref 30.0–36.0)
MCV: 97.7 fL (ref 80.0–100.0)
Monocytes Absolute: 0.3 K/uL (ref 0.1–1.0)
Monocytes Relative: 21 %
Neutro Abs: 0.7 K/uL — ABNORMAL LOW (ref 1.7–7.7)
Neutrophils Relative %: 59 %
Platelets: 25 K/uL — ABNORMAL LOW (ref 150–400)
RBC: 3.49 MIL/uL — ABNORMAL LOW (ref 4.22–5.81)
RDW: 12.5 % (ref 11.5–15.5)
WBC: 1.2 K/uL — ABNORMAL LOW (ref 4.0–10.5)
nRBC: 0 % (ref 0.0–0.2)

## 2024-04-04 LAB — COMPREHENSIVE METABOLIC PANEL WITH GFR
ALT: 16 U/L (ref 0–44)
AST: 12 U/L — ABNORMAL LOW (ref 15–41)
Albumin: 3.3 g/dL — ABNORMAL LOW (ref 3.5–5.0)
Alkaline Phosphatase: 56 U/L (ref 38–126)
Anion gap: 4 — ABNORMAL LOW (ref 5–15)
BUN: 18 mg/dL (ref 8–23)
CO2: 25 mmol/L (ref 22–32)
Calcium: 8.7 mg/dL — ABNORMAL LOW (ref 8.9–10.3)
Chloride: 109 mmol/L (ref 98–111)
Creatinine, Ser: 1.06 mg/dL (ref 0.61–1.24)
GFR, Estimated: >60 mL/min (ref >60)
Glucose, Bld: 96 mg/dL (ref 70–99)
Potassium: 4.4 mmol/L (ref 3.5–5.1)
Sodium: 138 mmol/L (ref 135–145)
Total Bilirubin: 0.5 mg/dL (ref 0.0–1.2)
Total Protein: 6.3 g/dL — ABNORMAL LOW (ref 6.5–8.1)

## 2024-04-04 LAB — RAD ONC ARIA SESSION SUMMARY
Course Elapsed Days: 15
Plan Fractions Treated to Date: 12
Plan Prescribed Dose Per Fraction: 1.8 Gy
Plan Total Fractions Prescribed: 30
Plan Total Prescribed Dose: 54 Gy
Reference Point Dosage Given to Date: 21.6 Gy
Reference Point Session Dosage Given: 1.8 Gy
Session Number: 12

## 2024-04-04 MED ORDER — SODIUM CHLORIDE 0.9% FLUSH: 10.0000 mL | Freq: Once | INTRAVENOUS | Status: DC

## 2024-04-04 NOTE — Telephone Encounter (Signed)
 8/26 @ 10:30 am Patient's wife stop by patient still in MedOnc, waiting to be call.  Called L2 and L3 machine, so they are aware.  Patient will be late for his treatment appt on today.

## 2024-04-04 NOTE — Progress Notes (Signed)
 Pt does not have a PIC, PORT, or labs due today. Flush apt made in error by scheduler.  Pt. sent straight to Rad/onc for Rad TX.  This concludes the interaction.  Rosaline Minerva, LPN

## 2024-04-05 ENCOUNTER — Other Ambulatory Visit: Payer: Self-pay

## 2024-04-05 ENCOUNTER — Ambulatory Visit
Admission: RE | Admit: 2024-04-05 | Discharge: 2024-04-05 | Discharge status: Home / Self Care | Source: Ambulatory Visit | Attending: Radiation Oncology

## 2024-04-05 ENCOUNTER — Inpatient Hospital Stay

## 2024-04-05 DIAGNOSIS — C210 Malignant neoplasm of anus, unspecified: Secondary | ICD-10-CM

## 2024-04-05 LAB — COMPREHENSIVE METABOLIC PANEL WITH GFR
ALT: 15 U/L (ref 0–44)
AST: 13 U/L — ABNORMAL LOW (ref 15–41)
Albumin: 3.3 g/dL — ABNORMAL LOW (ref 3.5–5.0)
Alkaline Phosphatase: 56 U/L (ref 38–126)
Anion gap: 3 — ABNORMAL LOW (ref 5–15)
BUN: 24 mg/dL — ABNORMAL HIGH (ref 8–23)
CO2: 27 mmol/L (ref 22–32)
Calcium: 8.9 mg/dL (ref 8.9–10.3)
Chloride: 110 mmol/L (ref 98–111)
Creatinine, Ser: 0.98 mg/dL (ref 0.61–1.24)
GFR, Estimated: >60 mL/min (ref >60)
Glucose, Bld: 96 mg/dL (ref 70–99)
Potassium: 3.9 mmol/L (ref 3.5–5.1)
Sodium: 140 mmol/L (ref 135–145)
Total Bilirubin: 0.3 mg/dL (ref 0.0–1.2)
Total Protein: 6.4 g/dL — ABNORMAL LOW (ref 6.5–8.1)

## 2024-04-05 LAB — CBC WITH DIFFERENTIAL/PLATELET
Abs Immature Granulocytes: 0.02 K/uL (ref 0.00–0.07)
Basophils Absolute: 0 K/uL (ref 0.0–0.1)
Basophils Relative: 1 %
Eosinophils Absolute: 0 K/uL (ref 0.0–0.5)
Eosinophils Relative: 3 %
HCT: 35.2 % — ABNORMAL LOW (ref 39.0–52.0)
Hemoglobin: 11.9 g/dL — ABNORMAL LOW (ref 13.0–17.0)
Immature Granulocytes: 2 %
Lymphocytes Relative: 12 %
Lymphs Abs: 0.1 K/uL — ABNORMAL LOW (ref 0.7–4.0)
MCH: 32.9 pg (ref 26.0–34.0)
MCHC: 33.8 g/dL (ref 30.0–36.0)
MCV: 97.2 fL (ref 80.0–100.0)
Monocytes Absolute: 0.1 K/uL (ref 0.1–1.0)
Monocytes Relative: 10 %
Neutro Abs: 0.7 K/uL — ABNORMAL LOW (ref 1.7–7.7)
Neutrophils Relative %: 72 %
Platelets: 29 K/uL — ABNORMAL LOW (ref 150–400)
RBC: 3.62 MIL/uL — ABNORMAL LOW (ref 4.22–5.81)
RDW: 12.5 % (ref 11.5–15.5)
Smear Review: NORMAL
WBC: 1 K/uL — ABNORMAL LOW (ref 4.0–10.5)
nRBC: 0 % (ref 0.0–0.2)

## 2024-04-05 LAB — RAD ONC ARIA SESSION SUMMARY
Course Elapsed Days: 16
Plan Fractions Treated to Date: 13
Plan Prescribed Dose Per Fraction: 1.8 Gy
Plan Total Fractions Prescribed: 30
Plan Total Prescribed Dose: 54 Gy
Reference Point Dosage Given to Date: 23.4 Gy
Reference Point Session Dosage Given: 1.8 Gy
Session Number: 13

## 2024-04-06 ENCOUNTER — Other Ambulatory Visit: Payer: Self-pay

## 2024-04-06 ENCOUNTER — Ambulatory Visit
Admission: RE | Admit: 2024-04-06 | Discharge: 2024-04-06 | Disposition: A | Discharge status: Home / Self Care | Source: Ambulatory Visit | Attending: Radiation Oncology | Admitting: Radiation Oncology

## 2024-04-06 ENCOUNTER — Encounter: Payer: Self-pay | Admitting: Hematology

## 2024-04-06 ENCOUNTER — Inpatient Hospital Stay

## 2024-04-06 DIAGNOSIS — C210 Malignant neoplasm of anus, unspecified: Secondary | ICD-10-CM | POA: Diagnosis not present

## 2024-04-06 LAB — RAD ONC ARIA SESSION SUMMARY
Course Elapsed Days: 17
Plan Fractions Treated to Date: 14
Plan Prescribed Dose Per Fraction: 1.8 Gy
Plan Total Fractions Prescribed: 30
Plan Total Prescribed Dose: 54 Gy
Reference Point Dosage Given to Date: 25.2 Gy
Reference Point Session Dosage Given: 1.8 Gy
Session Number: 14

## 2024-04-06 NOTE — Telephone Encounter (Addendum)
 Called patient to go over the lab results below as per Lacie Burton NP, patient voiced full understanding and had no further questions at this time.  Also ordered CBC for 8/29  ----- Message from Lacie K Burton sent at 04/04/2024  1:29 PM EDT ----- Please let pt know CBC - ANC 0.7 and plt 25K. Review neutropenia/thrombocytopenia precautions. Please add on CBC 8/29.   Thanks Lacie NP ----- Message ----- From: Rebecka, Lab In [place] Sent: 04/04/2024  12:28 PM EDT To: [name], MD

## 2024-04-07 ENCOUNTER — Other Ambulatory Visit: Payer: Self-pay

## 2024-04-07 ENCOUNTER — Ambulatory Visit
Admission: RE | Admit: 2024-04-07 | Discharge: 2024-04-07 | Disposition: A | Discharge status: Home / Self Care | Source: Ambulatory Visit | Attending: Radiation Oncology | Admitting: Radiation Oncology

## 2024-04-07 ENCOUNTER — Inpatient Hospital Stay

## 2024-04-07 DIAGNOSIS — C210 Malignant neoplasm of anus, unspecified: Secondary | ICD-10-CM | POA: Diagnosis not present

## 2024-04-07 LAB — COMPREHENSIVE METABOLIC PANEL WITH GFR
ALT: 12 U/L (ref 0–44)
AST: 11 U/L — ABNORMAL LOW (ref 15–41)
Albumin: 3.3 g/dL — ABNORMAL LOW (ref 3.5–5.0)
Alkaline Phosphatase: 58 U/L (ref 38–126)
Anion gap: 5 (ref 5–15)
BUN: 20 mg/dL (ref 8–23)
CO2: 25 mmol/L (ref 22–32)
Calcium: 8.7 mg/dL — ABNORMAL LOW (ref 8.9–10.3)
Chloride: 110 mmol/L (ref 98–111)
Creatinine, Ser: 0.86 mg/dL (ref 0.61–1.24)
GFR, Estimated: >60 mL/min (ref >60)
Glucose, Bld: 100 mg/dL — ABNORMAL HIGH (ref 70–99)
Potassium: 3.9 mmol/L (ref 3.5–5.1)
Sodium: 140 mmol/L (ref 135–145)
Total Bilirubin: 0.3 mg/dL (ref 0.0–1.2)
Total Protein: 6.5 g/dL (ref 6.5–8.1)

## 2024-04-07 LAB — CBC WITH DIFFERENTIAL (CANCER CENTER ONLY)
Abs Immature Granulocytes: 0.01 K/uL (ref 0.00–0.07)
Basophils Absolute: 0 K/uL (ref 0.0–0.1)
Basophils Relative: 1 %
Eosinophils Absolute: 0 K/uL (ref 0.0–0.5)
Eosinophils Relative: 2 %
HCT: 33.7 % — ABNORMAL LOW (ref 39.0–52.0)
Hemoglobin: 11.3 g/dL — ABNORMAL LOW (ref 13.0–17.0)
Immature Granulocytes: 1 %
Lymphocytes Relative: 13 %
Lymphs Abs: 0.2 K/uL — ABNORMAL LOW (ref 0.7–4.0)
MCH: 33.1 pg (ref 26.0–34.0)
MCHC: 33.5 g/dL (ref 30.0–36.0)
MCV: 98.8 fL (ref 80.0–100.0)
Monocytes Absolute: 0.2 K/uL (ref 0.1–1.0)
Monocytes Relative: 10 %
Neutro Abs: 1.4 K/uL — ABNORMAL LOW (ref 1.7–7.7)
Neutrophils Relative %: 73 %
Platelet Count: 40 K/uL — ABNORMAL LOW (ref 150–400)
RBC: 3.41 MIL/uL — ABNORMAL LOW (ref 4.22–5.81)
RDW: 13.1 % (ref 11.5–15.5)
WBC Count: 1.9 K/uL — ABNORMAL LOW (ref 4.0–10.5)
nRBC: 0 % (ref 0.0–0.2)

## 2024-04-07 LAB — RAD ONC ARIA SESSION SUMMARY
Course Elapsed Days: 18
Plan Fractions Treated to Date: 15
Plan Prescribed Dose Per Fraction: 1.8 Gy
Plan Total Fractions Prescribed: 30
Plan Total Prescribed Dose: 54 Gy
Reference Point Dosage Given to Date: 27 Gy
Reference Point Session Dosage Given: 1.8 Gy
Session Number: 15

## 2024-04-10 NOTE — Progress Notes (Unsigned)
 [facility]   [phone] [phone]    [name], MD as PCP - General (Hematology) [name], [name], RN as Oncology Nurse Navigator   CHIEF COMPLAINT: Follow up anal cancer  Oncology History  Anal cancer [facility])  03/07/2024 Initial Diagnosis   Anal cancer (HCC)   03/07/2024 Cancer Staging   Staging form: Anus, AJCC V9 - Clinical: Stage IIIA (cT3, cN0, cM0) - Signed by [name], MD on 03/20/2024   03/20/2024 -  Chemotherapy   Patient is on Treatment Plan : ANUS Mitomycin  D1,28 + 5FU D1-4, 28-31 q32d        CURRENT THERAPY: Concurrent chemoRT with 5FU/mitomycin  on weeks 1 and 5, starting 03/20/24  INTERVAL HISTORY Mr. Derek Booker returns for follow up as scheduled. Tolerating radiation. Continues to adjust diet to manage stools. More solid foods contribute to solid BMs which are excruciating. Saw Dr. Elicia who recommended miralax or metamucil and a lidocaine  cream. Has had chronic headaches for decades and h/o folliculitis. In the past few days he has focal tenderness at the left scalp above his ear. Denies fall/injury to the area.   ROS  All other systems reviewed and negative  Past Medical History:  Diagnosis Date   DVT (deep venous thrombosis) (HCC)    Hyperlipidemia    Hypertension    Seizures (HCC)    Throat cancer [facility])      Past Surgical History:  Procedure Laterality Date   CHOLECYSTECTOMY     ORTHOPEDIC SURGERY       Outpatient Encounter Medications as of 04/11/2024  Medication Sig   cyanocobalamin (VITAMIN B12) 1000 MCG tablet Take 1,000 mcg by mouth daily.   diphenoxylate -atropine  (LOMOTIL ) 2.5-0.025 MG tablet Take 1 tablet by mouth every 6 (six) hours as needed for diarrhea or loose stools.   donepezil (ARICEPT) 10 MG tablet Take 10 mg by mouth daily.   gabapentin (NEURONTIN) 100 MG capsule Take 100 mg by mouth 3 (three) times daily.   levothyroxine  (SYNTHROID , LEVOTHROID) 112 MCG tablet Take 112 mcg by  mouth daily before breakfast.   lisinopril (ZESTRIL) 20 MG tablet Take 20 mg by mouth daily.   magic mouthwash (nystatin , lidocaine , diphenhydrAMINE, alum & mag hydroxide) suspension Take 5 mLs by mouth 4 (four) times daily as needed for mouth pain. Swish and Swallow   methylPREDNISolone  (MEDROL  DOSEPAK) 4 MG TBPK tablet Take with signs of chronic sinusitis and take as directed   mirabegron ER (MYRBETRIQ) 50 MG TB24 tablet Take 50 mg by mouth daily.   omeprazole (PRILOSEC) 20 MG capsule Take 20 mg by mouth daily.   ondansetron  (ZOFRAN ) 8 MG tablet Take 1 tablet (8 mg total) by mouth every 8 (eight) hours as needed for nausea or vomiting.   pravastatin  (PRAVACHOL ) 40 MG tablet Take 40 mg by mouth daily.   prochlorperazine  (COMPAZINE ) 10 MG tablet Take 1 tablet (10 mg total) by mouth every 6 (six) hours as needed for nausea or vomiting.   rivaroxaban  (XARELTO ) 20 MG TABS tablet Take 20 mg by mouth daily.   sertraline  (ZOLOFT ) 50 MG tablet Take 50 mg by mouth daily.   solifenacin (VESICARE) 5 MG tablet Take 5 mg by mouth daily.   sucralfate  (CARAFATE ) 1 g tablet Dissolve tablet in 8 oz pf water and drink.   SUMAtriptan  (IMITREX ) 100 MG tablet Take 100 mg by mouth every 2 (two) hours as needed for migraine. May repeat in 2 hours if headache persists or  recurs.   topiramate  (TOPAMAX ) 50 MG tablet Take 50 mg by mouth 2 (two) times daily.   No facility-administered encounter medications on file as of 04/11/2024.     Today's Vitals   04/11/24 1005  BP: 132/80  Pulse: (!) 52  Resp: 18  Temp: (!) 97.2 F (36.2 C)  TempSrc: Temporal  SpO2: 100%  Weight: 162 lb 9 oz (73.7 kg)   Auscultation of the apical pulse closer to 60   Body mass index is 22.05 kg/m.   ECOG PERFORMANCE STATUS: 1 - Symptomatic but completely ambulatory  PHYSICAL EXAM GENERAL:alert, no distress and comfortable SKIN: no rash, petechiae, ecchymosis HEENT: sclera clear.  Focal tenderness at the left lateral/parietal scalp  above the ear, no erythema or obvious folliculitis  LUNGS: clear with normal breathing effort HEART: regular rate & rhythm, no lower extremity edema ABDOMEN: abdomen soft, non-tender and normal bowel sounds RECTAL: external exam reveals erythema and shallow ulceration near the anus, mild discharge NEURO: alert & oriented x 3 with fluent speech   CBC    Latest Ref Rng & Units 04/11/2024    9:48 AM 04/07/2024   11:23 AM 04/05/2024    9:53 AM  CBC  WBC 4.0 - 10.5 K/uL 2.1  1.9  1.0   Hemoglobin 13.0 - 17.0 g/dL 88.1  88.6  88.0   Hematocrit 39.0 - 52.0 % 34.6  33.7  35.2   Platelets 150 - 400 K/uL 99  40  29       CMP     Latest Ref Rng & Units 04/11/2024    9:48 AM 04/07/2024   11:26 AM 04/05/2024    9:53 AM  CMP  Glucose 70 - 99 mg/dL 899  899  96   BUN 8 - 23 mg/dL 16  20  24    Creatinine 0.61 - 1.24 mg/dL 9.07  9.13  9.01   Sodium 135 - 145 mmol/L 141  140  140   Potassium 3.5 - 5.1 mmol/L 4.0  3.9  3.9   Chloride 98 - 111 mmol/L 111  110  110   CO2 22 - 32 mmol/L 25  25  27    Calcium 8.9 - 10.3 mg/dL 9.1  8.7  8.9   Total Protein 6.5 - 8.1 g/dL 6.8  6.5  6.4   Total Bilirubin 0.0 - 1.2 mg/dL 0.4  0.3  0.3   Alkaline Phos 38 - 126 U/L 61  58  56   AST 15 - 41 U/L 15  11  13    ALT 0 - 44 U/L 12  12  15        ASSESSMENT & PLAN:78 year old male   Anal cancer, cT3N0M0 - Patient presented with frequent and loose bowel movements -Colonoscopy showed 2 masses in upper and lower rectum, distal biopsy showed squamous cell carcinoma.  No nodal metastasis on MRI. - PET scan was negative for nodal or distant metastasis -Started concurrent chemoradiation with 5-FU and mitomycin  on 03/20/24 - s/p week 3 -[name] appears stable beginning week 4 chemoRT. Main SE is rectal pain from radiation and hard BMs. He has management strategies in place per GI. Knows to avoid topicals prior to radiation therapy. He declined pain medication. Otherwise doing well, maintains good PS -Exam reveals  expected skin changes of the perineum. Etiology of the scalp pain is unclear, he will monitor the site in case this is tenderness from early/evolving folliculitis.  -Labs reviewed, neutropenia and thrombocytopenia have significantly improved nadir plt 25K  ANC 0.7 during week 3) -Continue RT. He will return for lab, f/up, PICC placement, and week 5 chemo next week. I anticipate dose reduction for thrombocytopenia     PLAN: -Labs reviewed, thrombocytopenia and neutropenia improved -Continue daily RT with supportive care -PICC placement, lab, f/up, and week 5 chemo 9/8 as scheduled      All questions were answered. The patient knows to call the clinic with any problems, questions or concerns. No barriers to learning were detected.   Bryn Perkin K Daley Mooradian, NP 04/11/2024

## 2024-04-11 ENCOUNTER — Other Ambulatory Visit: Payer: Self-pay

## 2024-04-11 ENCOUNTER — Encounter

## 2024-04-11 ENCOUNTER — Encounter: Payer: Self-pay | Admitting: Nurse Practitioner

## 2024-04-11 ENCOUNTER — Inpatient Hospital Stay (HOSPITAL_BASED_OUTPATIENT_CLINIC_OR_DEPARTMENT_OTHER): Admitting: Nurse Practitioner

## 2024-04-11 ENCOUNTER — Inpatient Hospital Stay: Attending: Hematology

## 2024-04-11 ENCOUNTER — Ambulatory Visit
Admission: RE | Admit: 2024-04-11 | Discharge: 2024-04-11 | Disposition: A | Discharge status: Home / Self Care | Source: Ambulatory Visit | Attending: Radiation Oncology | Admitting: Radiation Oncology

## 2024-04-11 VITALS — BP 132/80 | HR 52 | Temp 97.2°F | Resp 18 | Wt 162.6 lb

## 2024-04-11 DIAGNOSIS — D696 Thrombocytopenia, unspecified: Secondary | ICD-10-CM | POA: Diagnosis not present

## 2024-04-11 DIAGNOSIS — C210 Malignant neoplasm of anus, unspecified: Secondary | ICD-10-CM

## 2024-04-11 DIAGNOSIS — Z79631 Long term (current) use of antimetabolite agent: Secondary | ICD-10-CM | POA: Insufficient documentation

## 2024-04-11 DIAGNOSIS — R300 Dysuria: Secondary | ICD-10-CM | POA: Insufficient documentation

## 2024-04-11 DIAGNOSIS — Z79632 Long term (current) use of antitumor antibiotic: Secondary | ICD-10-CM | POA: Insufficient documentation

## 2024-04-11 DIAGNOSIS — R21 Rash and other nonspecific skin eruption: Secondary | ICD-10-CM | POA: Diagnosis not present

## 2024-04-11 DIAGNOSIS — R309 Painful micturition, unspecified: Secondary | ICD-10-CM | POA: Insufficient documentation

## 2024-04-11 DIAGNOSIS — Z5111 Encounter for antineoplastic chemotherapy: Secondary | ICD-10-CM | POA: Insufficient documentation

## 2024-04-11 DIAGNOSIS — N390 Urinary tract infection, site not specified: Secondary | ICD-10-CM | POA: Diagnosis not present

## 2024-04-11 DIAGNOSIS — Z510 Encounter for antineoplastic radiation therapy: Secondary | ICD-10-CM | POA: Insufficient documentation

## 2024-04-11 DIAGNOSIS — R519 Headache, unspecified: Secondary | ICD-10-CM | POA: Insufficient documentation

## 2024-04-11 DIAGNOSIS — C20 Malignant neoplasm of rectum: Secondary | ICD-10-CM | POA: Insufficient documentation

## 2024-04-11 LAB — CBC WITH DIFFERENTIAL/PLATELET
Abs Immature Granulocytes: 0.02 K/uL (ref 0.00–0.07)
Basophils Absolute: 0 K/uL (ref 0.0–0.1)
Basophils Relative: 1 %
Eosinophils Absolute: 0 K/uL (ref 0.0–0.5)
Eosinophils Relative: 1 %
HCT: 34.6 % — ABNORMAL LOW (ref 39.0–52.0)
Hemoglobin: 11.8 g/dL — ABNORMAL LOW (ref 13.0–17.0)
Immature Granulocytes: 1 %
Lymphocytes Relative: 11 %
Lymphs Abs: 0.2 K/uL — ABNORMAL LOW (ref 0.7–4.0)
MCH: 33.3 pg (ref 26.0–34.0)
MCHC: 34.1 g/dL (ref 30.0–36.0)
MCV: 97.7 fL (ref 80.0–100.0)
Monocytes Absolute: 0.2 K/uL (ref 0.1–1.0)
Monocytes Relative: 9 %
Neutro Abs: 1.6 K/uL — ABNORMAL LOW (ref 1.7–7.7)
Neutrophils Relative %: 77 %
Platelets: 99 K/uL — ABNORMAL LOW (ref 150–400)
RBC: 3.54 MIL/uL — ABNORMAL LOW (ref 4.22–5.81)
RDW: 13.4 % (ref 11.5–15.5)
WBC: 2.1 K/uL — ABNORMAL LOW (ref 4.0–10.5)
nRBC: 0 % (ref 0.0–0.2)

## 2024-04-11 LAB — COMPREHENSIVE METABOLIC PANEL WITH GFR
ALT: 12 U/L (ref 0–44)
AST: 15 U/L (ref 15–41)
Albumin: 3.5 g/dL (ref 3.5–5.0)
Alkaline Phosphatase: 61 U/L (ref 38–126)
Anion gap: 5 (ref 5–15)
BUN: 16 mg/dL (ref 8–23)
CO2: 25 mmol/L (ref 22–32)
Calcium: 9.1 mg/dL (ref 8.9–10.3)
Chloride: 111 mmol/L (ref 98–111)
Creatinine, Ser: 0.92 mg/dL (ref 0.61–1.24)
GFR, Estimated: >60 mL/min (ref >60)
Glucose, Bld: 100 mg/dL — ABNORMAL HIGH (ref 70–99)
Potassium: 4 mmol/L (ref 3.5–5.1)
Sodium: 141 mmol/L (ref 135–145)
Total Bilirubin: 0.4 mg/dL (ref 0.0–1.2)
Total Protein: 6.8 g/dL (ref 6.5–8.1)

## 2024-04-11 LAB — RAD ONC ARIA SESSION SUMMARY
Course Elapsed Days: 22
Plan Fractions Treated to Date: 16
Plan Prescribed Dose Per Fraction: 1.8 Gy
Plan Total Fractions Prescribed: 30
Plan Total Prescribed Dose: 54 Gy
Reference Point Dosage Given to Date: 28.8 Gy
Reference Point Session Dosage Given: 1.8 Gy
Session Number: 16

## 2024-04-12 ENCOUNTER — Other Ambulatory Visit: Payer: Self-pay

## 2024-04-12 ENCOUNTER — Ambulatory Visit
Admission: RE | Admit: 2024-04-12 | Discharge: 2024-04-12 | Disposition: A | Discharge status: Home / Self Care | Source: Ambulatory Visit | Attending: Radiation Oncology

## 2024-04-12 DIAGNOSIS — Z510 Encounter for antineoplastic radiation therapy: Secondary | ICD-10-CM | POA: Diagnosis not present

## 2024-04-12 LAB — RAD ONC ARIA SESSION SUMMARY
Course Elapsed Days: 23
Plan Fractions Treated to Date: 17
Plan Prescribed Dose Per Fraction: 1.8 Gy
Plan Total Fractions Prescribed: 30
Plan Total Prescribed Dose: 54 Gy
Reference Point Dosage Given to Date: 30.6 Gy
Reference Point Session Dosage Given: 1.8 Gy
Session Number: 17

## 2024-04-13 ENCOUNTER — Other Ambulatory Visit: Payer: Self-pay

## 2024-04-13 ENCOUNTER — Ambulatory Visit
Admission: RE | Admit: 2024-04-13 | Discharge: 2024-04-13 | Disposition: A | Discharge status: Home / Self Care | Source: Ambulatory Visit | Attending: Radiation Oncology

## 2024-04-13 DIAGNOSIS — Z510 Encounter for antineoplastic radiation therapy: Secondary | ICD-10-CM | POA: Diagnosis not present

## 2024-04-13 LAB — RAD ONC ARIA SESSION SUMMARY
Course Elapsed Days: 24
Plan Fractions Treated to Date: 18
Plan Prescribed Dose Per Fraction: 1.8 Gy
Plan Total Fractions Prescribed: 30
Plan Total Prescribed Dose: 54 Gy
Reference Point Dosage Given to Date: 32.4 Gy
Reference Point Session Dosage Given: 1.8 Gy
Session Number: 18

## 2024-04-14 ENCOUNTER — Ambulatory Visit
Admission: RE | Admit: 2024-04-14 | Discharge: 2024-04-14 | Disposition: A | Discharge status: Home / Self Care | Source: Ambulatory Visit | Attending: Radiation Oncology | Admitting: Radiation Oncology

## 2024-04-14 ENCOUNTER — Other Ambulatory Visit: Payer: Self-pay

## 2024-04-14 DIAGNOSIS — Z510 Encounter for antineoplastic radiation therapy: Secondary | ICD-10-CM | POA: Diagnosis not present

## 2024-04-14 LAB — RAD ONC ARIA SESSION SUMMARY
Course Elapsed Days: 25
Plan Fractions Treated to Date: 19
Plan Prescribed Dose Per Fraction: 1.8 Gy
Plan Total Fractions Prescribed: 30
Plan Total Prescribed Dose: 54 Gy
Reference Point Dosage Given to Date: 34.2 Gy
Reference Point Session Dosage Given: 1.8 Gy
Session Number: 19

## 2024-04-17 ENCOUNTER — Encounter (INDEPENDENT_AMBULATORY_CARE_PROVIDER_SITE_OTHER): Payer: Self-pay

## 2024-04-17 ENCOUNTER — Inpatient Hospital Stay

## 2024-04-17 ENCOUNTER — Ambulatory Visit
Admission: RE | Admit: 2024-04-17 | Discharge: 2024-04-17 | Disposition: A | Discharge status: Home / Self Care | Source: Ambulatory Visit | Attending: Radiation Oncology | Admitting: Radiation Oncology

## 2024-04-17 ENCOUNTER — Telehealth: Payer: Self-pay | Admitting: *Deleted

## 2024-04-17 ENCOUNTER — Inpatient Hospital Stay: Admitting: Hematology

## 2024-04-17 ENCOUNTER — Other Ambulatory Visit: Payer: Self-pay

## 2024-04-17 ENCOUNTER — Encounter (INDEPENDENT_AMBULATORY_CARE_PROVIDER_SITE_OTHER): Admitting: Otolaryngology

## 2024-04-17 ENCOUNTER — Ambulatory Visit (HOSPITAL_COMMUNITY)
Admission: RE | Admit: 2024-04-17 | Discharge: 2024-04-17 | Disposition: A | Discharge status: Home / Self Care | Source: Ambulatory Visit | Attending: Hematology

## 2024-04-17 ENCOUNTER — Other Ambulatory Visit: Payer: Self-pay | Admitting: Hematology

## 2024-04-17 VITALS — BP 112/68 | HR 54 | Temp 97.8°F | Resp 17 | Ht 72.0 in | Wt 161.4 lb

## 2024-04-17 DIAGNOSIS — C210 Malignant neoplasm of anus, unspecified: Secondary | ICD-10-CM

## 2024-04-17 DIAGNOSIS — Z452 Encounter for adjustment and management of vascular access device: Secondary | ICD-10-CM

## 2024-04-17 DIAGNOSIS — Z510 Encounter for antineoplastic radiation therapy: Secondary | ICD-10-CM | POA: Diagnosis not present

## 2024-04-17 LAB — CBC WITH DIFFERENTIAL/PLATELET
Abs Immature Granulocytes: 0.02 K/uL (ref 0.00–0.07)
Basophils Absolute: 0 K/uL (ref 0.0–0.1)
Basophils Relative: 1 %
Eosinophils Absolute: 0.1 K/uL (ref 0.0–0.5)
Eosinophils Relative: 3 %
HCT: 33.3 % — ABNORMAL LOW (ref 39.0–52.0)
Hemoglobin: 11.3 g/dL — ABNORMAL LOW (ref 13.0–17.0)
Immature Granulocytes: 1 %
Lymphocytes Relative: 8 %
Lymphs Abs: 0.2 K/uL — ABNORMAL LOW (ref 0.7–4.0)
MCH: 33.2 pg (ref 26.0–34.0)
MCHC: 33.9 g/dL (ref 30.0–36.0)
MCV: 97.9 fL (ref 80.0–100.0)
Monocytes Absolute: 0.3 K/uL (ref 0.1–1.0)
Monocytes Relative: 15 %
Neutro Abs: 1.3 K/uL — ABNORMAL LOW (ref 1.7–7.7)
Neutrophils Relative %: 72 %
Platelets: 217 K/uL (ref 150–400)
RBC: 3.4 MIL/uL — ABNORMAL LOW (ref 4.22–5.81)
RDW: 14.7 % (ref 11.5–15.5)
WBC: 1.8 K/uL — ABNORMAL LOW (ref 4.0–10.5)
nRBC: 0 % (ref 0.0–0.2)

## 2024-04-17 LAB — RAD ONC ARIA SESSION SUMMARY
Course Elapsed Days: 28
Plan Fractions Treated to Date: 20
Plan Prescribed Dose Per Fraction: 1.8 Gy
Plan Total Fractions Prescribed: 30
Plan Total Prescribed Dose: 54 Gy
Reference Point Dosage Given to Date: 36 Gy
Reference Point Session Dosage Given: 1.8 Gy
Session Number: 20

## 2024-04-17 LAB — CMP (CANCER CENTER ONLY)
ALT: 13 U/L (ref 0–44)
AST: 15 U/L (ref 15–41)
Albumin: 3.7 g/dL (ref 3.5–5.0)
Alkaline Phosphatase: 66 U/L (ref 38–126)
Anion gap: 6 (ref 5–15)
BUN: 13 mg/dL (ref 8–23)
CO2: 24 mmol/L (ref 22–32)
Calcium: 8.8 mg/dL — ABNORMAL LOW (ref 8.9–10.3)
Chloride: 110 mmol/L (ref 98–111)
Creatinine: 0.86 mg/dL (ref 0.61–1.24)
GFR, Estimated: >60 mL/min (ref >60)
Glucose, Bld: 130 mg/dL — ABNORMAL HIGH (ref 70–99)
Potassium: 3.7 mmol/L (ref 3.5–5.1)
Sodium: 140 mmol/L (ref 135–145)
Total Bilirubin: 0.4 mg/dL (ref 0.0–1.2)
Total Protein: 6.8 g/dL (ref 6.5–8.1)

## 2024-04-17 MED ORDER — SODIUM CHLORIDE 0.9 % IV SOLN
8000.0000 mg | INTRAVENOUS | Status: DC
  Administered 2024-04-17: 8000 mg via INTRAVENOUS
  Filled 2024-04-17: qty 160

## 2024-04-17 MED ORDER — HEPARIN SOD (PORK) LOCK FLUSH 100 UNIT/ML IV SOLN
INTRAVENOUS | Status: AC
  Filled 2024-04-17: qty 5

## 2024-04-17 MED ORDER — SODIUM CHLORIDE 0.9% FLUSH: 10.0000 mL | INTRAVENOUS | Status: DC | PRN

## 2024-04-17 MED ORDER — LIDOCAINE HCL 1 % IJ SOLN
INTRAMUSCULAR | Status: AC
  Filled 2024-04-17: qty 20

## 2024-04-17 MED ORDER — HEPARIN SOD (PORK) LOCK FLUSH 100 UNIT/ML IV SOLN
500.0000 [IU] | Freq: Once | INTRAVENOUS | Status: AC
  Administered 2024-04-17: 500 [IU] via INTRAVENOUS

## 2024-04-17 MED ORDER — SODIUM CHLORIDE 0.9 % IV SOLN: INTRAVENOUS | Status: DC

## 2024-04-17 MED ORDER — LIDOCAINE HCL 1 % IJ SOLN
10.0000 mL | Freq: Once | INTRAMUSCULAR | Status: AC
  Administered 2024-04-17: 5 mL via INTRADERMAL

## 2024-04-17 MED ORDER — MITOMYCIN CHEMO IV INJECTION 20 MG
5.0000 mg/m2 | Freq: Once | INTRAVENOUS | Status: AC
  Administered 2024-04-17: 10 mg via INTRAVENOUS
  Filled 2024-04-17: qty 20

## 2024-04-17 MED ORDER — PROCHLORPERAZINE MALEATE 10 MG PO TABS
10.0000 mg | ORAL_TABLET | Freq: Once | ORAL | Status: AC
  Administered 2024-04-17: 10 mg via ORAL
  Filled 2024-04-17: qty 1

## 2024-04-17 MED ORDER — SODIUM CHLORIDE 0.9% FLUSH
10.0000 mL | Freq: Once | INTRAVENOUS | Status: AC
  Administered 2024-04-17: 10 mL

## 2024-04-17 MED ORDER — LOPERAMIDE HCL 2 MG PO CAPS
2.0000 mg | ORAL_CAPSULE | Freq: Once | ORAL | Status: AC
  Administered 2024-04-17: 2 mg via ORAL
  Filled 2024-04-17: qty 1

## 2024-04-17 NOTE — Telephone Encounter (Signed)
 PC to patient's wife, Diane - left VM.  She called over the weekend stating patient was very constipated & asked for advice.  Requested call back to let us  know how patient is doing today.  Diane called back, stated she bought some Dulcolax yesterday & patient had a BM.  Informed her in the future if he becomes very constipated, she can buy Magnesium Citrate OTC - patient can drink 1/2 bottle & wait 1-2 hours.  If no BM he can drink the other half.  He can drink up to bottles in 24 hours.  Also informed her he can take Miralax as directed on the bottle on a regular basis to prevent constipation. She verbalizes understanding.

## 2024-04-17 NOTE — Patient Instructions (Signed)
 [facility] - A DEPT OF [facility]  Discharge Instructions: Thank you for choosing [facility] to provide your oncology and hematology care.   If you have a lab appointment with the Cancer Center, please go directly to the Cancer Center and check in at the registration area.   Wear comfortable clothing and clothing appropriate for easy access to any Portacath or PICC line.   We strive to give you quality time with your provider. You may need to reschedule your appointment if you arrive late (15 or more minutes).  Arriving late affects you and other patients whose appointments are after yours.  Also, if you miss three or more appointments without notifying the office, you may be dismissed from the clinic at the provider's discretion.      For prescription refill requests, have your pharmacy contact our office and allow 72 hours for refills to be completed.    Today you received the following chemotherapy and/or immunotherapy agents: Mitomycin , Audricil     To help prevent nausea and vomiting after your treatment, we encourage you to take your nausea medication as directed.  BELOW ARE SYMPTOMS THAT SHOULD BE REPORTED IMMEDIATELY: *FEVER GREATER THAN 100.4 F (38 C) OR HIGHER *CHILLS OR SWEATING *NAUSEA AND VOMITING THAT IS NOT CONTROLLED WITH YOUR NAUSEA MEDICATION *UNUSUAL SHORTNESS OF BREATH *UNUSUAL BRUISING OR BLEEDING *URINARY PROBLEMS (pain or burning when urinating, or frequent urination) *BOWEL PROBLEMS (unusual diarrhea, constipation, pain near the anus) TENDERNESS IN MOUTH AND THROAT WITH OR WITHOUT PRESENCE OF ULCERS (sore throat, sores in mouth, or a toothache) UNUSUAL RASH, SWELLING OR PAIN  UNUSUAL VAGINAL DISCHARGE OR ITCHING   Items with * indicate a potential emergency and should be followed up as soon as possible or go to the Emergency Department if any problems should occur.  Please show the CHEMOTHERAPY ALERT CARD or  IMMUNOTHERAPY ALERT CARD at check-in to the Emergency Department and triage nurse.  Should you have questions after your visit or need to cancel or reschedule your appointment, please contact [facility] - A DEPT OF [facility]  Dept: [phone]  and follow the prompts.  Office hours are 8:00 a.m. to 4:30 p.m. Monday - Friday. Please note that voicemails left after 4:00 p.m. may not be returned until the following business day.  We are closed weekends and major holidays. You have access to a nurse at all times for urgent questions. Please call the main number to the clinic Dept: [phone] and follow the prompts.   For any non-urgent questions, you may also contact your provider using MyChart. We now offer e-Visits for anyone 23 and older to request care online for non-urgent symptoms. For details visit [email or web address].   Also download the MyChart app! Go to the app store, search MyChart, open the app, select [place], and log in with your MyChart username and password.

## 2024-04-17 NOTE — Assessment & Plan Note (Signed)
 cT3N0M0 - Patient presented with frequent and loose bowel movements -Colonoscopy showed 2 masses in upper and lower rectum, post biopsy showed squamous cell carcinoma.  No nodal metastasis on MRI. - PET scan was negative for nodal or distant metastasis -He started concurrent chemoradiation with 5-FU and mitomycin  on March 20, 2024

## 2024-04-17 NOTE — Progress Notes (Signed)
 [facility]   [phone] [phone]   Clinic Follow up Note   [name], MD as PCP - General (Hematology) [name], [name], RN as Oncology Nurse Navigator  Date of Service:  04/17/2024  CHIEF COMPLAINT: f/u of anal cancer  CURRENT THERAPY:  Concurrent chemoradiation with mitomycin  and 5-FU  Oncology History   Anal cancer (HCC) [id number] - Patient presented with frequent and loose bowel movements -Colonoscopy showed 2 masses in upper and lower rectum, post biopsy showed squamous cell carcinoma.  No nodal metastasis on MRI. - PET scan was negative for nodal or distant metastasis -He started concurrent chemoradiation with 5-FU and mitomycin  on March 20, 2024  Assessment & Plan Anal cancer undergoing chemoradiation Currently in week five of chemoradiation with increased side effects. Blood counts show low white count and previously low platelet count, necessitating dose adjustment of chemotherapy. Platelet count has since recovered, allowing continuation of treatment with dose adjustment. - Proceed with chemotherapy with reduced dose due to previous low platelet count - Advise wearing a mask when going out to prevent infection - Instruct to check temperature and report any signs of infection - Continue hydration and nutritional support with soft foods and electrolyte solutions - Schedule weekly follow-up appointments  Chemotherapy-induced oral mucositis Oral mucositis present since the first week of chemotherapy. He has been using a mouthwash to manage symptoms. - Refill prescription for mouthwash - Advise alternating with regular mouthwash and maintaining oral hygiene  Chemotherapy-induced diarrhea Experiencing diarrhea, exacerbated by certain foods, occurring several times a day, particularly after eating. Dietary adjustments are necessary to manage symptoms. - Advise dietary modifications to reduce diarrhea, focusing on  carbohydrates and proteins, and avoiding vegetables and fruits - Encourage intake of nutritional supplements like Boost and electrolyte solutions like Gatorlite  Chemotherapy-induced neutropenia White blood cell count is low but slightly improved from previous levels. Neutropenia expected to worsen post-chemotherapy. - Monitor for signs of infection and advise on precautions to prevent infection  Chemotherapy-induced thrombocytopenia Platelet count was very low two weeks ago, prompting a reduction in chemotherapy dose. Platelet count has since recovered, allowing continuation of treatment with dose adjustment. - Adjust chemotherapy dose to prevent further thrombocytopenia - Monitor platelet levels  Plan - Lab reviewed, adequate for treatment, will proceed with 829 chemotherapy with mitomycin  50% dose reduction due to thrombocytopenia after first cycle, 5-FU dose will be adjusted based on his current weight - PICC line removal after he completes chemotherapy this Friday - Lab and follow-up weekly for the next 2 weeks - Continue radiation as scheduled   SUMMARY OF ONCOLOGIC HISTORY: Oncology History  Anal cancer (HCC)  03/07/2024 Initial Diagnosis   Anal cancer (HCC)   03/07/2024 Cancer Staging   Staging form: Anus, AJCC V9 - Clinical: Stage IIIA (cT3, cN0, cM0) - Signed by [name], MD on 03/20/2024   03/20/2024 -  Chemotherapy   Patient is on Treatment Plan : ANUS Mitomycin  D1,28 + 5FU D1-4, 28-31 q32d        Discussed the use of AI scribe software for clinical note transcription with the patient, who gave verbal consent to proceed.  History of Present Illness Derek Booker is a 78 year old male with anal cancer who presents for follow-up.  He is in week five of treatment for anal cancer, experiencing pronounced side effects, particularly mucositis. He uses a 'magical mouthwash' for management and requires a refill. Diarrhea occurs several times daily, often triggered by  eating  solid foods. He manages bowel movements with Dulcolax, following recent constipation. His diet includes bone broth, eggs, and small amounts of nutritional supplements like Ensure and Boost. He drinks Gatorlite for hydration.  His white blood cell count remains low, with slight improvement. Platelet count dropped significantly two weeks ago, leading to a reduced chemotherapy dose.  He anticipates completing treatment on May 01, 2024, and plans to have his PICC line removed post-treatment to improve comfort during showers.     All other systems were reviewed with the patient and are negative.  MEDICAL HISTORY:  Past Medical History:  Diagnosis Date   DVT (deep venous thrombosis) (HCC)    Hyperlipidemia    Hypertension    Seizures (HCC)    Throat cancer (HCC)     SURGICAL HISTORY: Past Surgical History:  Procedure Laterality Date   CHOLECYSTECTOMY     ORTHOPEDIC SURGERY      I have reviewed the social history and family history with the patient and they are unchanged from previous note.  ALLERGIES:  is allergic to nitrates, organic; chocolate; and sulfites.  MEDICATIONS:  Current Outpatient Medications  Medication Sig Dispense Refill   cyanocobalamin (VITAMIN B12) 1000 MCG tablet Take 1,000 mcg by mouth daily.     diphenoxylate -atropine  (LOMOTIL ) 2.5-0.025 MG tablet Take 1 tablet by mouth every 6 (six) hours as needed for diarrhea or loose stools. 30 tablet 0   donepezil (ARICEPT) 10 MG tablet Take 10 mg by mouth daily.     gabapentin (NEURONTIN) 100 MG capsule Take 100 mg by mouth 3 (three) times daily.     levothyroxine  (SYNTHROID , LEVOTHROID) 112 MCG tablet Take 112 mcg by mouth daily before breakfast.     lisinopril (ZESTRIL) 20 MG tablet Take 20 mg by mouth daily.     magic mouthwash (nystatin , lidocaine , diphenhydrAMINE, alum & mag hydroxide) suspension Take 5 mLs by mouth 4 (four) times daily as needed for mouth pain. Swish and Swallow 140 mL 1    methylPREDNISolone  (MEDROL  DOSEPAK) 4 MG TBPK tablet Take with signs of chronic sinusitis and take as directed 1 each 1   mirabegron ER (MYRBETRIQ) 50 MG TB24 tablet Take 50 mg by mouth daily.     omeprazole (PRILOSEC) 20 MG capsule Take 20 mg by mouth daily.     ondansetron  (ZOFRAN ) 8 MG tablet Take 1 tablet (8 mg total) by mouth every 8 (eight) hours as needed for nausea or vomiting. 30 tablet 2   pravastatin  (PRAVACHOL ) 40 MG tablet Take 40 mg by mouth daily.     prochlorperazine  (COMPAZINE ) 10 MG tablet Take 1 tablet (10 mg total) by mouth every 6 (six) hours as needed for nausea or vomiting. 30 tablet 1   rivaroxaban  (XARELTO ) 20 MG TABS tablet Take 20 mg by mouth daily.     sertraline  (ZOLOFT ) 50 MG tablet Take 50 mg by mouth daily.     solifenacin (VESICARE) 5 MG tablet Take 5 mg by mouth daily.     sucralfate  (CARAFATE ) 1 g tablet Dissolve tablet in 8 oz pf water and drink. 28 tablet 0   SUMAtriptan  (IMITREX ) 100 MG tablet Take 100 mg by mouth every 2 (two) hours as needed for migraine. May repeat in 2 hours if headache persists or recurs.     topiramate  (TOPAMAX ) 50 MG tablet Take 50 mg by mouth 2 (two) times daily.     No current facility-administered medications for this visit.   Facility-Administered Medications Ordered in Other Visits  Medication Dose Route  Frequency Provider Last Rate Last Admin   0.9 %  sodium chloride  infusion   Intravenous Continuous [name], MD 10 mL/hr at 04/17/24 1415 New Bag at 04/17/24 1415   fluorouracil  (ADRUCIL ) 8,000 mg in sodium chloride  0.9 % 90 mL chemo infusion  8,000 mg Intravenous 4 days [name], MD       sodium chloride  flush (NS) 0.9 % injection 10 mL  10 mL Intracatheter PRN [name], MD        PHYSICAL EXAMINATION: ECOG PERFORMANCE STATUS: 2 - Symptomatic, <50% confined to bed  Vitals:   04/17/24 1304  BP: 112/68  Pulse: (!) 54  Resp: 17  Temp: 97.8 F (36.6 C)  SpO2: 98%   Wt Readings from Last 3 Encounters:  04/17/24 161  lb 6.4 oz (73.2 kg)  04/11/24 162 lb 9 oz (73.7 kg)  04/04/24 162 lb 4.8 oz (73.6 kg)     GENERAL:alert, no distress and comfortable SKIN: skin color, texture, turgor are normal, no rashes or significant lesions EYES: normal, Conjunctiva are pink and non-injected, sclera clear NECK: supple, thyroid normal size, non-tender, without nodularity LYMPH:  no palpable lymphadenopathy in the cervical, axillary  LUNGS: clear to auscultation and percussion with normal breathing effort HEART: regular rate & rhythm and no murmurs and no lower extremity edema ABDOMEN:abdomen soft, non-tender and normal bowel sounds Musculoskeletal:no cyanosis of digits and no clubbing  NEURO: alert & oriented x 3 with fluent speech, no focal motor/sensory deficits  Physical Exam RECTAL: Anus normal EXTREMITIES: No leg swelling SKIN: No skin breakdown  LABORATORY DATA:  I have reviewed the data as listed    Latest Ref Rng & Units 04/17/2024   12:28 PM 04/11/2024    9:48 AM 04/07/2024   11:23 AM  CBC  WBC 4.0 - 10.5 K/uL 1.8  2.1  1.9   Hemoglobin 13.0 - 17.0 g/dL 88.6  88.1  88.6   Hematocrit 39.0 - 52.0 % 33.3  34.6  33.7   Platelets 150 - 400 K/uL 217  99  40         Latest Ref Rng & Units 04/17/2024   12:21 PM 04/11/2024    9:48 AM 04/07/2024   11:26 AM  CMP  Glucose 70 - 99 mg/dL 869  899  899   BUN 8 - 23 mg/dL 13  16  20    Creatinine 0.61 - 1.24 mg/dL 9.13  9.07  9.13   Sodium 135 - 145 mmol/L 140  141  140   Potassium 3.5 - 5.1 mmol/L 3.7  4.0  3.9   Chloride 98 - 111 mmol/L 110  111  110   CO2 22 - 32 mmol/L 24  25  25    Calcium 8.9 - 10.3 mg/dL 8.8  9.1  8.7   Total Protein 6.5 - 8.1 g/dL 6.8  6.8  6.5   Total Bilirubin 0.0 - 1.2 mg/dL 0.4  0.4  0.3   Alkaline Phos 38 - 126 U/L 66  61  58   AST 15 - 41 U/L 15  15  11    ALT 0 - 44 U/L 13  12  12        RADIOGRAPHIC STUDIES: I have personally reviewed the radiological images as listed and agreed with the findings in the report. IR PICC  PLACEMENT RIGHT >5 YRS INC IMG GUIDE Result Date: 04/17/2024 INDICATION: Hx of anal cancer. IR consulted for PICC placement for chemotherapy. EXAM: ULTRASOUND AND FLUOROSCOPIC GUIDED PICC LINE INSERTION MEDICATIONS:  3 mL 1% lidocaine  CONTRAST:  None FLUOROSCOPY TIME:  Radiation Exposure Index (as provided by the fluoroscopic device): 1.4 mGy Kerma COMPLICATIONS: None immediate. TECHNIQUE: The procedure, risks, benefits, and alternatives were explained to the patient and informed written consent was obtained. A timeout was performed prior to the initiation of the procedure. The right upper extremity was prepped with chlorhexidine in a sterile fashion, and a sterile drape was applied covering the operative field. Maximum barrier sterile technique with sterile gowns and gloves were used for the procedure. A timeout was performed prior to the initiation of the procedure. Local anesthesia was provided with 1% lidocaine . Under direct ultrasound guidance, the brachial vein was accessed with a micropuncture kit after the overlying soft tissues were anesthetized with 1% lidocaine . Real-time ultrasound guidance was utilized for vascular access including the acquisition of a permanent ultrasound image documenting patency of the accessed vessel. A guidewire was advanced to the level of the superior caval-atrial junction for measurement purposes and the PICC line was cut to length. A peel-away sheath was placed and a 36 cm, 5 [place], dual lumen was inserted to level of the superior caval-atrial junction. A post procedure spot fluoroscopic was obtained. The catheter easily aspirated and flushed and was secured in place with stat lock device. A dressing was applied. The patient tolerated the procedure well without immediate post procedural complication. FINDINGS: After catheter placement, the tip lies within the superior cavoatrial junction. The catheter aspirates and flushes normally and is ready for immediate use. IMPRESSION:  Successful ultrasound and fluoroscopic guided placement of a right brachial vein approach, 36 cm, 5 [place], dual lumen PICC with tip at the superior caval-atrial junction. The PICC line is ready for immediate use. Performed by: Wyatt Pommier, PA-C Electronically Signed   By: Juliene Balder M.D.   On: 04/17/2024 11:01      No orders of the defined types were placed in this encounter.  All questions were answered. The patient knows to call the clinic with any problems, questions or concerns. No barriers to learning was detected. The total time spent in the appointment was 40 minutes, including review of chart and various tests results, discussions about plan of care and coordination of care plan     [name], MD 04/17/2024

## 2024-04-17 NOTE — Progress Notes (Signed)
 Nutrition Follow-up:  Patient with anal cancer and followed by Dr Lanny and Dr Dewey.  Final radiation is scheduled for Sept 22nd  Met with patient during infusion.  Reports that he is trying to find a balance between diarrhea and constipation.  Wants to eat but fear of having bowel movement when has radiation treatment.  Eats a little more on weekends.  Typical day is water, gatrolyte, 2 eggs, beef broth, yogurt, bread.  Has the supplements at home and tends to drink them more on weekend.  Taking lomotil    Medications: lomotil , compazine , zofran , carafate , vit B 12  Labs: reviewed  Anthropometrics:   Weight 161 lb 6.4 oz 161 lb on 8/20 UBW 175-180 lb per patient   NUTRITION DIAGNOSIS: Unintentional weight loss stable   INTERVENTION:  Reviewed foods to choose with diarrhea.   Encouraged banatrol.  Handout on diarrhea provided   MONITORING, EVALUATION, GOAL: weight trends, intake   NEXT VISIT: as needed  Mattix Imhof B. Dasie SOLON, CSO, LDN Registered Dietitian [phone]

## 2024-04-17 NOTE — Progress Notes (Signed)
 Per Dr. Lanny: dose of 5FU pump today = 8000 mg over 96 hrs.  Landen Knoedler, Pharm.D., CPP 04/17/2024@2 :31 PM

## 2024-04-17 NOTE — Progress Notes (Signed)
 error

## 2024-04-18 ENCOUNTER — Ambulatory Visit
Admission: RE | Admit: 2024-04-18 | Discharge: 2024-04-18 | Disposition: A | Discharge status: Home / Self Care | Source: Ambulatory Visit | Attending: Radiation Oncology

## 2024-04-18 ENCOUNTER — Other Ambulatory Visit: Payer: Self-pay

## 2024-04-18 DIAGNOSIS — Z510 Encounter for antineoplastic radiation therapy: Secondary | ICD-10-CM | POA: Diagnosis not present

## 2024-04-18 LAB — RAD ONC ARIA SESSION SUMMARY
Course Elapsed Days: 29
Plan Fractions Treated to Date: 21
Plan Prescribed Dose Per Fraction: 1.8 Gy
Plan Total Fractions Prescribed: 30
Plan Total Prescribed Dose: 54 Gy
Reference Point Dosage Given to Date: 37.8 Gy
Reference Point Session Dosage Given: 1.8 Gy
Session Number: 21

## 2024-04-19 ENCOUNTER — Other Ambulatory Visit: Payer: Self-pay

## 2024-04-19 ENCOUNTER — Ambulatory Visit
Admission: RE | Admit: 2024-04-19 | Discharge: 2024-04-19 | Disposition: A | Discharge status: Home / Self Care | Source: Ambulatory Visit | Attending: Radiation Oncology

## 2024-04-19 DIAGNOSIS — Z510 Encounter for antineoplastic radiation therapy: Secondary | ICD-10-CM | POA: Diagnosis not present

## 2024-04-19 LAB — RAD ONC ARIA SESSION SUMMARY
Course Elapsed Days: 30
Plan Fractions Treated to Date: 22
Plan Prescribed Dose Per Fraction: 1.8 Gy
Plan Total Fractions Prescribed: 30
Plan Total Prescribed Dose: 54 Gy
Reference Point Dosage Given to Date: 39.6 Gy
Reference Point Session Dosage Given: 1.8 Gy
Session Number: 22

## 2024-04-20 ENCOUNTER — Other Ambulatory Visit: Payer: Self-pay

## 2024-04-20 ENCOUNTER — Ambulatory Visit
Admission: RE | Admit: 2024-04-20 | Discharge: 2024-04-20 | Disposition: A | Discharge status: Home / Self Care | Source: Ambulatory Visit | Attending: Radiation Oncology

## 2024-04-20 DIAGNOSIS — Z510 Encounter for antineoplastic radiation therapy: Secondary | ICD-10-CM | POA: Diagnosis not present

## 2024-04-20 LAB — RAD ONC ARIA SESSION SUMMARY
Course Elapsed Days: 31
Plan Fractions Treated to Date: 23
Plan Prescribed Dose Per Fraction: 1.8 Gy
Plan Total Fractions Prescribed: 30
Plan Total Prescribed Dose: 54 Gy
Reference Point Dosage Given to Date: 41.4 Gy
Reference Point Session Dosage Given: 1.8 Gy
Session Number: 23

## 2024-04-21 ENCOUNTER — Encounter: Payer: Self-pay | Admitting: Nurse Practitioner

## 2024-04-21 ENCOUNTER — Ambulatory Visit
Admission: RE | Admit: 2024-04-21 | Discharge: 2024-04-21 | Disposition: A | Discharge status: Home / Self Care | Source: Ambulatory Visit | Attending: Radiation Oncology | Admitting: Radiation Oncology

## 2024-04-21 ENCOUNTER — Ambulatory Visit
Admission: RE | Admit: 2024-04-21 | Discharge: 2024-04-21 | Discharge status: Home / Self Care | Source: Ambulatory Visit | Attending: Radiation Oncology

## 2024-04-21 ENCOUNTER — Inpatient Hospital Stay

## 2024-04-21 ENCOUNTER — Encounter: Payer: Self-pay | Admitting: Hematology

## 2024-04-21 ENCOUNTER — Other Ambulatory Visit: Payer: Self-pay

## 2024-04-21 ENCOUNTER — Encounter: Payer: Self-pay | Admitting: Physician Assistant

## 2024-04-21 VITALS — BP 123/65 | HR 58 | Temp 98.4°F | Resp 16

## 2024-04-21 DIAGNOSIS — C210 Malignant neoplasm of anus, unspecified: Secondary | ICD-10-CM

## 2024-04-21 DIAGNOSIS — Z510 Encounter for antineoplastic radiation therapy: Secondary | ICD-10-CM | POA: Diagnosis not present

## 2024-04-21 LAB — RAD ONC ARIA SESSION SUMMARY
Course Elapsed Days: 32
Plan Fractions Treated to Date: 24
Plan Prescribed Dose Per Fraction: 1.8 Gy
Plan Total Fractions Prescribed: 30
Plan Total Prescribed Dose: 54 Gy
Reference Point Dosage Given to Date: 43.2 Gy
Reference Point Session Dosage Given: 1.8 Gy
Session Number: 24

## 2024-04-21 MED ORDER — SODIUM CHLORIDE 0.9% FLUSH: 10.0000 mL | INTRAVENOUS | Status: DC | PRN

## 2024-04-21 NOTE — Progress Notes (Signed)
 Patient presented for PICC removal per order. Dressing removed, site cleaned, and PICC removed per institutional policy/procedure. Sterile, occlusive dressing applied. Direct pressure held for 5 minutes. Patient observed for 30 minutes. No bleeding noted at site. Vitals stable. Patient discharged, ambulatory to lobby with family. Discharge instructions reviewed. Patient able to verbalize possible problems and when to call clinic or head to ED.

## 2024-04-24 ENCOUNTER — Ambulatory Visit
Admission: RE | Admit: 2024-04-24 | Discharge: 2024-04-24 | Disposition: A | Discharge status: Home / Self Care | Source: Ambulatory Visit | Attending: Radiation Oncology

## 2024-04-24 ENCOUNTER — Other Ambulatory Visit: Payer: Self-pay | Admitting: Physician Assistant

## 2024-04-24 ENCOUNTER — Other Ambulatory Visit: Payer: Self-pay

## 2024-04-24 DIAGNOSIS — Z510 Encounter for antineoplastic radiation therapy: Secondary | ICD-10-CM | POA: Diagnosis not present

## 2024-04-24 LAB — RAD ONC ARIA SESSION SUMMARY
Course Elapsed Days: 35
Plan Fractions Treated to Date: 25
Plan Prescribed Dose Per Fraction: 1.8 Gy
Plan Total Fractions Prescribed: 30
Plan Total Prescribed Dose: 54 Gy
Reference Point Dosage Given to Date: 45 Gy
Reference Point Session Dosage Given: 1.8 Gy
Session Number: 25

## 2024-04-25 ENCOUNTER — Ambulatory Visit
Admission: RE | Admit: 2024-04-25 | Discharge: 2024-04-25 | Disposition: A | Discharge status: Home / Self Care | Source: Ambulatory Visit | Attending: Radiation Oncology | Admitting: Radiation Oncology

## 2024-04-25 ENCOUNTER — Encounter: Payer: Self-pay | Admitting: Hematology

## 2024-04-25 ENCOUNTER — Other Ambulatory Visit: Payer: Self-pay | Admitting: Radiation Oncology

## 2024-04-25 ENCOUNTER — Inpatient Hospital Stay: Admitting: Nutrition

## 2024-04-25 ENCOUNTER — Inpatient Hospital Stay

## 2024-04-25 ENCOUNTER — Inpatient Hospital Stay (HOSPITAL_BASED_OUTPATIENT_CLINIC_OR_DEPARTMENT_OTHER): Admitting: Physician Assistant

## 2024-04-25 ENCOUNTER — Other Ambulatory Visit (HOSPITAL_COMMUNITY): Payer: Self-pay

## 2024-04-25 ENCOUNTER — Other Ambulatory Visit: Payer: Self-pay

## 2024-04-25 VITALS — BP 103/66 | HR 61 | Temp 97.7°F | Resp 13 | Wt 155.0 lb

## 2024-04-25 VITALS — BP 102/68

## 2024-04-25 DIAGNOSIS — Z510 Encounter for antineoplastic radiation therapy: Secondary | ICD-10-CM | POA: Diagnosis not present

## 2024-04-25 DIAGNOSIS — R197 Diarrhea, unspecified: Secondary | ICD-10-CM

## 2024-04-25 DIAGNOSIS — C210 Malignant neoplasm of anus, unspecified: Secondary | ICD-10-CM

## 2024-04-25 LAB — RAD ONC ARIA SESSION SUMMARY
Course Elapsed Days: 36
Plan Fractions Treated to Date: 26
Plan Prescribed Dose Per Fraction: 1.8 Gy
Plan Total Fractions Prescribed: 30
Plan Total Prescribed Dose: 54 Gy
Reference Point Dosage Given to Date: 46.8 Gy
Reference Point Session Dosage Given: 1.8 Gy
Session Number: 26

## 2024-04-25 LAB — CBC WITH DIFFERENTIAL/PLATELET
Abs Immature Granulocytes: 0.01 K/uL (ref 0.00–0.07)
Basophils Absolute: 0 K/uL (ref 0.0–0.1)
Basophils Relative: 0 %
Eosinophils Absolute: 0.1 K/uL (ref 0.0–0.5)
Eosinophils Relative: 5 %
HCT: 34.8 % — ABNORMAL LOW (ref 39.0–52.0)
Hemoglobin: 11.6 g/dL — ABNORMAL LOW (ref 13.0–17.0)
Immature Granulocytes: 1 %
Lymphocytes Relative: 5 %
Lymphs Abs: 0.1 K/uL — ABNORMAL LOW (ref 0.7–4.0)
MCH: 33 pg (ref 26.0–34.0)
MCHC: 33.3 g/dL (ref 30.0–36.0)
MCV: 99.1 fL (ref 80.0–100.0)
Monocytes Absolute: 0.1 K/uL (ref 0.1–1.0)
Monocytes Relative: 8 %
Neutro Abs: 1 K/uL — ABNORMAL LOW (ref 1.7–7.7)
Neutrophils Relative %: 81 %
Platelets: 146 K/uL — ABNORMAL LOW (ref 150–400)
RBC: 3.51 MIL/uL — ABNORMAL LOW (ref 4.22–5.81)
RDW: 14.6 % (ref 11.5–15.5)
WBC: 1.2 K/uL — ABNORMAL LOW (ref 4.0–10.5)
nRBC: 0 % (ref 0.0–0.2)

## 2024-04-25 LAB — COMPREHENSIVE METABOLIC PANEL WITH GFR
ALT: 21 U/L (ref 0–44)
AST: 19 U/L (ref 15–41)
Albumin: 3.8 g/dL (ref 3.5–5.0)
Alkaline Phosphatase: 75 U/L (ref 38–126)
Anion gap: 7 (ref 5–15)
BUN: 18 mg/dL (ref 8–23)
CO2: 23 mmol/L (ref 22–32)
Calcium: 9 mg/dL (ref 8.9–10.3)
Chloride: 109 mmol/L (ref 98–111)
Creatinine, Ser: 0.95 mg/dL (ref 0.61–1.24)
GFR, Estimated: >60 mL/min (ref >60)
Glucose, Bld: 89 mg/dL (ref 70–99)
Potassium: 3.6 mmol/L (ref 3.5–5.1)
Sodium: 139 mmol/L (ref 135–145)
Total Bilirubin: 0.8 mg/dL (ref 0.0–1.2)
Total Protein: 7 g/dL (ref 6.5–8.1)

## 2024-04-25 MED ORDER — OXYCODONE HCL 5 MG PO TABS: 5.0000 mg | ORAL_TABLET | ORAL | 0 refills | Status: DC | PRN

## 2024-04-25 MED ORDER — OXYCODONE HCL 5 MG PO TABS
5.0000 mg | ORAL_TABLET | Freq: Once | ORAL | Status: AC
  Administered 2024-04-25: 5 mg via ORAL

## 2024-04-25 MED ORDER — DIPHENOXYLATE-ATROPINE 2.5-0.025 MG PO TABS
1.0000 | ORAL_TABLET | Freq: Four times a day (QID) | ORAL | 0 refills | Status: DC | PRN
  Filled 2024-04-25: qty 30, 8d supply, fill #0

## 2024-04-25 MED ORDER — OXYCODONE HCL 5 MG PO TABS
ORAL_TABLET | ORAL | Status: AC
  Filled 2024-04-25: qty 1

## 2024-04-25 MED ORDER — SUCRALFATE 1 G PO TABS
ORAL_TABLET | ORAL | 0 refills | Status: AC
  Filled 2024-04-25: qty 28, 28d supply, fill #0

## 2024-04-25 MED ORDER — SODIUM CHLORIDE 0.9 % IV SOLN: Freq: Once | INTRAVENOUS | Status: AC

## 2024-04-25 MED ORDER — NYSTATIN 100000 UNIT/ML MT SUSP
5.0000 mL | Freq: Four times a day (QID) | OROMUCOSAL | 1 refills | Status: AC | PRN
  Filled 2024-04-25: qty 140, 7d supply, fill #0

## 2024-04-25 MED ORDER — TRAMADOL HCL 50 MG PO TABS
50.0000 mg | ORAL_TABLET | Freq: Four times a day (QID) | ORAL | 0 refills | Status: DC | PRN
  Filled 2024-04-25: qty 15, 4d supply, fill #0

## 2024-04-25 NOTE — Progress Notes (Addendum)
 [facility] OFFICE PROGRESS NOTE  Derek Callander, MD [address] [place] [address]  DIAGNOSIS:  f/u of anal cancer   CURRENT THERAPY: Concurrent chemoradiation with mitomycin  and 5-FU, last dose on 04/21/24. His last day of radiation is scheduled for 05/01/24.    INTERVAL HISTORY: Derek Booker 78 y.o. male returns to the clinic today for a follow-up visit for symptom management related to pain related to radiation burns.  He is accompanied by his wife. The patient is being treated for anal cancer.  He completed his portion of treatment with chemotherapy last week.  His last day of radiation is on 05/01/2024.  He had been having some cytopenias with chemotherapy for which Dr. Lanny reduced his dose with last weeks treatment.    Additionally he contacted the clinic endorsing severe rectal pain and rash.   He has a severe rash that feels like a burn, spreading completely around his genitalia and anus, causing significant pain during urination and bowel movements. He has been experiencing diarrhea for several weeks, which exacerbates the pain. He describes the sensation as 'it burns, it burns.'   He has not taken any pain medication regularly, although he has Tylenol  available. He has used Lotrimin spray for the rash, but it forms a lacquer-like layer that is difficult to remove. He has also been using RectaCare ointment for the past few weeks, which he finds somewhat helpful, although it is difficult to obtain.  He is reluctant to take pain medication due to concerns related to the side effect profile.  Does not like taking medications that will make him drowsy.  He has previously seen physical therapy.  He reports swelling around the area of the penis. He has not been eating or drinking much due to the pain associated with urination and bowel movements, leading to dehydration. He experiences immediate bowel movements and needs to stay close to a bathroom.   He has a history  of a mini-stroke and is hesitant to take gabapentin due to past experiences. He was prescribed this 100 mg TID last month.   He has been using Magic Mouthwash for a sore throat. It looks like carafate  was prescribed last month but they were unaware and have not picked this up.   He feels exhausted due to lack of sleep and constant pain, stating 'I'm just exhausted.' He experiences pain when standing, sitting, and walking, and is always hungry and thirsty.   He has been having persistent diarrhea.  He is using wet wipes instead of toilet paper.  He has tried Imodium .  He is requiring a refill of Lomotil .  He has not been alternating these 2 medications.  Reports yellow discharge from the urethra.  He denies any fever, chills, night sweats, upper respiratory infection.    He reports taste and appetite alterations.  Due to preference he does not like having dairy.  They did see the nutritionist once.  He is here for toxicity check and lab work.   MEDICAL HISTORY: Past Medical History:  Diagnosis Date   DVT (deep venous thrombosis) (HCC)    Hyperlipidemia    Hypertension    Seizures (HCC)    Throat cancer (HCC)     ALLERGIES:  is allergic to nitrates, organic; chocolate; and sulfites.  MEDICATIONS:  Current Outpatient Medications  Medication Sig Dispense Refill   traMADol  (ULTRAM ) 50 MG tablet Take 1 tablet (50 mg total) by mouth every 6 (six) hours as needed. 15 tablet 0  cyanocobalamin (VITAMIN B12) 1000 MCG tablet Take 1,000 mcg by mouth daily.     diphenoxylate -atropine  (LOMOTIL ) 2.5-0.025 MG tablet TAKE 1 TABLET BY MOUTH EVERY 6 HOURS AS NEEDED FOR DIARRHEA OR LOOSE STOOLS 30 tablet 1   diphenoxylate -atropine  (LOMOTIL ) 2.5-0.025 MG tablet Take 1 tablet by mouth every 6 (six) hours as needed for diarrhea or loose stools. 30 tablet 0   donepezil (ARICEPT) 10 MG tablet Take 10 mg by mouth daily.     gabapentin (NEURONTIN) 100 MG capsule Take 100 mg by mouth 3 (three) times daily.      levothyroxine  (SYNTHROID , LEVOTHROID) 112 MCG tablet Take 112 mcg by mouth daily before breakfast.     lisinopril (ZESTRIL) 20 MG tablet Take 20 mg by mouth daily.     magic mouthwash (nystatin , lidocaine , diphenhydrAMINE, alum & mag hydroxide) suspension Swish & swallow 5 mLs by mouth 4 (four) times daily as needed for mouth pain. 140 mL 1   methylPREDNISolone  (MEDROL  DOSEPAK) 4 MG TBPK tablet Take with signs of chronic sinusitis and take as directed 1 each 1   mirabegron ER (MYRBETRIQ) 50 MG TB24 tablet Take 50 mg by mouth daily.     omeprazole (PRILOSEC) 20 MG capsule Take 20 mg by mouth daily.     ondansetron  (ZOFRAN ) 8 MG tablet Take 1 tablet (8 mg total) by mouth every 8 (eight) hours as needed for nausea or vomiting. 30 tablet 2   pravastatin  (PRAVACHOL ) 40 MG tablet Take 40 mg by mouth daily.     prochlorperazine  (COMPAZINE ) 10 MG tablet Take 1 tablet (10 mg total) by mouth every 6 (six) hours as needed for nausea or vomiting. 30 tablet 1   rivaroxaban  (XARELTO ) 20 MG TABS tablet Take 20 mg by mouth daily.     sertraline  (ZOLOFT ) 50 MG tablet Take 50 mg by mouth daily.     solifenacin (VESICARE) 5 MG tablet Take 5 mg by mouth daily.     sucralfate  (CARAFATE ) 1 g tablet Dissolve tablet in 8 oz of water and drink as directed 28 tablet 0   SUMAtriptan  (IMITREX ) 100 MG tablet Take 100 mg by mouth every 2 (two) hours as needed for migraine. May repeat in 2 hours if headache persists or recurs.     topiramate  (TOPAMAX ) 50 MG tablet Take 50 mg by mouth 2 (two) times daily.     No current facility-administered medications for this visit.   Facility-Administered Medications Ordered in Other Visits  Medication Dose Route Frequency Provider Last Rate Last Admin   0.9 %  sodium chloride  infusion   Intravenous Once Gilbert Manolis L, PA-C        SURGICAL HISTORY:  Past Surgical History:  Procedure Laterality Date   CHOLECYSTECTOMY     ORTHOPEDIC SURGERY      REVIEW OF SYSTEMS:    Review of Systems  Constitutional: Positive for fatigue, weight loss, and appetite change. Negative for chills and fever.  HENT: Positive for mouth sore. Negative for nosebleeds and trouble swallowing.   Eyes: Negative for eye problems and icterus.  Respiratory: Negative for cough, hemoptysis, shortness of breath and wheezing.   Cardiovascular: Negative for chest pain and leg swelling.  Gastrointestinal: Positive for diarrhea. Positive for rectal pain. Negative for abdominal pain, constipation, diarrhea, nausea and vomiting.  Genitourinary: Positive for dysuria and erythema of the genitalia. Negative for bladder incontinence, frequency and hematuria.   Musculoskeletal: Negative for back pain, gait problem, neck pain and neck stiffness.  Skin: Positive for erythema from  the gluteal cleft to the genitalia/groin Neurological: Negative for dizziness, extremity weakness, gait problem, headaches, light-headedness and seizures.  Hematological: Negative for adenopathy. Does not bruise/bleed easily.  Psychiatric/Behavioral: Negative for confusion, depression and sleep disturbance. The patient is not nervous/anxious.     PHYSICAL EXAMINATION:  Blood pressure 103/66, pulse 61, temperature 97.7 F (36.5 C), temperature source Temporal, resp. rate 13, weight 155 lb (70.3 kg), SpO2 100%.  ECOG PERFORMANCE STATUS: 1  Physical Exam  Constitutional: Oriented to person, place, and time and well-developed, well-nourished, and in no distress.  HENT:  Head: Normocephalic and atraumatic.  Mouth/Throat: Oropharynx is clear and moist. No oropharyngeal exudate.  Eyes: Conjunctivae are normal. Right eye exhibits no discharge. Left eye exhibits no discharge. No scleral icterus.  Neck: Normal range of motion. Neck supple.  Cardiovascular: Normal rate, regular rhythm, normal heart sounds and intact distal pulses.   Pulmonary/Chest: Effort normal and breath sounds normal. No respiratory distress. No wheezes. No  rales.  Abdominal: Soft. Bowel sounds are normal. Exhibits no distension and no mass. There is no tenderness.  Rectal: Erythema encompassing gluteal cleft, sacrum, perineum, groin, and genitalia. Mild desquamation in right groin and anal region.  Musculoskeletal: Normal range of motion. Exhibits no edema.  Lymphadenopathy:    No cervical adenopathy.  Neurological: Alert and oriented to person, place, and time. Exhibits normal muscle tone. Gait normal. Coordination normal.  Skin: Skin is warm and dry. No rash noted. Not diaphoretic. No erythema. No pallor.  Psychiatric: Mood, memory and judgment normal.  Vitals reviewed.  LABORATORY DATA: Lab Results  Component Value Date   WBC 1.2 (L) 04/25/2024   HGB 11.6 (L) 04/25/2024   HCT 34.8 (L) 04/25/2024   MCV 99.1 04/25/2024   PLT 146 (L) 04/25/2024      Chemistry      Component Value Date/Time   NA 139 04/25/2024 0925   K 3.6 04/25/2024 0925   CL 109 04/25/2024 0925   CO2 23 04/25/2024 0925   BUN 18 04/25/2024 0925   CREATININE 0.95 04/25/2024 0925   CREATININE 0.86 04/17/2024 1221      Component Value Date/Time   CALCIUM 9.0 04/25/2024 0925   ALKPHOS 75 04/25/2024 0925   AST 19 04/25/2024 0925   AST 15 04/17/2024 1221   ALT 21 04/25/2024 0925   ALT 13 04/17/2024 1221   BILITOT 0.8 04/25/2024 0925   BILITOT 0.4 04/17/2024 1221       RADIOGRAPHIC STUDIES:  IR PICC PLACEMENT RIGHT >5 YRS INC IMG GUIDE Result Date: 04/17/2024 INDICATION: Hx of anal cancer. IR consulted for PICC placement for chemotherapy. EXAM: ULTRASOUND AND FLUOROSCOPIC GUIDED PICC LINE INSERTION MEDICATIONS: 3 mL 1% lidocaine  CONTRAST:  None FLUOROSCOPY TIME:  Radiation Exposure Index (as provided by the fluoroscopic device): 1.4 mGy Kerma COMPLICATIONS: None immediate. TECHNIQUE: The procedure, risks, benefits, and alternatives were explained to the patient and informed written consent was obtained. A timeout was performed prior to the initiation of the  procedure. The right upper extremity was prepped with chlorhexidine in a sterile fashion, and a sterile drape was applied covering the operative field. Maximum barrier sterile technique with sterile gowns and gloves were used for the procedure. A timeout was performed prior to the initiation of the procedure. Local anesthesia was provided with 1% lidocaine . Under direct ultrasound guidance, the brachial vein was accessed with a micropuncture kit after the overlying soft tissues were anesthetized with 1% lidocaine . Real-time ultrasound guidance was utilized for vascular access including the acquisition of  a permanent ultrasound image documenting patency of the accessed vessel. A guidewire was advanced to the level of the superior caval-atrial junction for measurement purposes and the PICC line was cut to length. A peel-away sheath was placed and a 36 cm, 5 [place], dual lumen was inserted to level of the superior caval-atrial junction. A post procedure spot fluoroscopic was obtained. The catheter easily aspirated and flushed and was secured in place with stat lock device. A dressing was applied. The patient tolerated the procedure well without immediate post procedural complication. FINDINGS: After catheter placement, the tip lies within the superior cavoatrial junction. The catheter aspirates and flushes normally and is ready for immediate use. IMPRESSION: Successful ultrasound and fluoroscopic guided placement of a right brachial vein approach, 36 cm, 5 French, dual lumen PICC with tip at the superior caval-atrial junction. The PICC line is ready for immediate use. Performed by: Wyatt Pommier, PA-C Electronically Signed   By: Juliene Balder M.D.   On: 04/17/2024 11:01     ASSESSMENT/PLAN:  Anal cancer (HCC) [id number] - Patient presented with frequent and loose bowel movements -Colonoscopy showed 2 masses in upper and lower rectum, post biopsy showed squamous cell carcinoma.  No nodal metastasis on MRI. - PET scan  was negative for nodal or distant metastasis -He started concurrent chemoradiation with 5-FU and mitomycin  on March 20, 2024 -Completed his last chemotherapy last week.  His last day radiation is on 05/01/2024.     Assessment & Plan Anal cancer undergoing chemoradiation - Advise wearing a mask when going out to prevent infection.  - Instruct to check temperature and report any signs of infection.  Reviewed neutropenic precautions. - Continue hydration and nutritional support with soft foods and electrolyte solutions - Schedule weekly follow-up appointments - radiation dermatitis with significant pain. Radiation informed me today that the deep pelvic lymph nodes are included which is what is contributing to the anterior radiation skin related changes.  - Provide handout with home care instructions, including use of lukewarm water, avoiding fragrances, etc. - Stop using Lotrimin spray due to adverse effects.   Chemotherapy-induced oral mucositis Oral mucositis. He had been using a mouthwash to manage symptoms. - Refill prescription for mouthwash -- Discuss use of Carafate  to coat the throat and alleviate symptoms.   Chemotherapy-induced diarrhea poor oral intake Experiencing diarrhea, exacerbated by certain foods, occurring several times a day, particularly after eating. Dietary adjustments are necessary to manage symptoms. - Dr. Lanny previously advise dietary modifications to reduce diarrhea, focusing on carbohydrates and proteins, and avoiding vegetables and fruits. - Encourage intake of nutritional supplements like Boost and electrolyte solutions like Gatorlite - Will refill his lomotil  to alternate with imodium  if needed - Will arrange IVF today. Advised to contact me if he feels like he needs to come later this week for additional IVF - Will reach out to nutrition to see if they can call him or see him. He has certain preferences and tries to avoid dairy. However, he is also trying to  stay away from fiber and fruits/vegetables. The foods he desires to eat are limited due to taste alterations.    Chemotherapy-induced neutropenia White blood cell count is low.  - Monitor for signs of infection and advise on precautions to prevent infection  Fatigue secondary to cancer treatment Significant fatigue likely secondary to ongoing cancer treatment, with exhaustion and disrupted sleep due to pain and frequent bowel movements. - Encourage use of Tylenol  for pain management. - Discuss potential use of Tylenol   PM for improved sleep quality. - Provide anticipatory guidance on managing fatigue and pain.  Urinary symptoms, possible urinary tract infection Burning sensation during urination and yellow discharge suggest possible urinary tract infection. - Perform urine test and culture to rule out urinary tract infection. - Consider Azo for symptomatic relief if urine test is negative.     Plan - Lab and follow-up weekly next week with Dr. Lanny - Continue radiation as scheduled, reached out to radiation today who will assess him while down there today from a radiation stand point. They informed me that additional topicals will exacerbate the radiation.  - UA/Culture to r/o UTI. If negative, recommend azo - Refilled lomotil , Carafate , and MMW - Patient reluctant to take pain medication but will send tramadol  (declined oxycodone  or norco) in to alternate with tylenol  if needed - IVF today, will call me if they would like to be set up for additional IVF this week - Neutropenic precautions reviewed - Will reach out to nutritionist to see if they can see him while receiving IVF today or contact him for telephone visit this week.   Orders Placed This Encounter  Procedures   Urine Culture    Standing Status:   Future    Number of Occurrences:   1    Expected Date:   04/25/2024    Expiration Date:   04/25/2025   Urinalysis, Complete w Microscopic    Standing Status:   Future    Number  of Occurrences:   1    Expected Date:   04/25/2024    Expiration Date:   04/25/2025     The total time spent in the appointment was 40+ minutes  Derek Couse L Nazli Penn, PA-C 04/25/24

## 2024-04-25 NOTE — Progress Notes (Signed)
 Nurse instructed to give patient Oxycodone  5 mg before his radiation treatment.  Patient complaints of pain 10/10 to his anus.  He has received 25/30 fractions to his anus.  Vitals 97.4 temporal, 73 HR, 16 RR, 79/60 BP (Dinamap), 100%, BP manual check 102/68, per PA Perkins patient can receive 5 mg Oxycodone .  Patient advised to hold his lisinopril due to decreased oral intake and low BP.  Patient and wife verbalized understanding.  Patient was transported via wheelchair to receive his radiation treatment.  Once this is complete he will be taken up to the symptom management clinic to receive IV fluids.  Sharene EDISON RN, BSN

## 2024-04-25 NOTE — Patient Instructions (Signed)

## 2024-04-25 NOTE — Progress Notes (Signed)
 Nutrition follow-up completed with patient during IV fluids in symptom management clinic.  Patient completed chemotherapy with 5-FU and mitomycin .  He will complete radiation therapy on September 22.  Weight: 155 pounds September 16 161 pounds 6.4 ounces September 8 161 pounds August 20 180 pounds June 4  14% weight loss in 3 months/clinically significant.  Labs include glucose 130  Medications include Lomotil , Magic mouthwash, Carafate , Prilosec, Compazine , tramadol .  Patient reporting extreme pain from radiation therapy to anus.  Reports increased diarrhea.  Multiple stools cause increased burning sensation.  Noted mucositis.  Patient admits that he stopped eating and drinking due to extreme pain thinking it would decrease urination and bowel movements.  He has not been taking pain medication as prescribed.  Reports a bowel movement approximately every 3 hours.  This occurred when patient was eating as well as when patient stopped eating.  He states he did try some oral nutrition supplements and Banatrol which I had previously given at his first visit.  He states this helped however did not continue after he ran out.  Wife is not currently present in room.  Nutrition diagnosis: Unintentional weight loss, ongoing  Intervention: Educated on importance of increasing nutrition to promote healing from radiation therapy. Pointed out no difference in amount of diarrhea when patient was eating versus when patient did not eat. Provided additional samples of clear liquid supplements in the Banatrol. Encouraged medication as prescribed including Lomotil  and pain medication.  Monitoring, evaluation, goals: Patient will increase calories and protein to support healing and minimize weight loss.  Next visit: To be scheduled with upcoming visit as needed.  **Disclaimer: This note was dictated with voice recognition software. Similar sounding words can inadvertently be transcribed and this note may  contain transcription errors which may not have been corrected upon publication of note.**

## 2024-04-25 NOTE — Progress Notes (Signed)
 Pt seen today and struggling with pain from dermatitis, urethritis and loose stool despite lomotil . We discussed adding oxycodone , and he was hypotensive but getting IV fluids today. He will discontinue lisinopril for now and a UA with culture will be sent as well.

## 2024-04-26 ENCOUNTER — Other Ambulatory Visit (HOSPITAL_COMMUNITY): Payer: Self-pay

## 2024-04-26 ENCOUNTER — Other Ambulatory Visit: Payer: Self-pay

## 2024-04-26 ENCOUNTER — Other Ambulatory Visit: Payer: Self-pay | Admitting: Physician Assistant

## 2024-04-26 ENCOUNTER — Telehealth: Payer: Self-pay | Admitting: Medical Oncology

## 2024-04-26 ENCOUNTER — Telehealth: Payer: Self-pay | Admitting: Physician Assistant

## 2024-04-26 ENCOUNTER — Ambulatory Visit
Admission: RE | Admit: 2024-04-26 | Discharge: 2024-04-26 | Disposition: A | Discharge status: Home / Self Care | Source: Ambulatory Visit | Attending: Radiation Oncology | Admitting: Radiation Oncology

## 2024-04-26 DIAGNOSIS — N390 Urinary tract infection, site not specified: Secondary | ICD-10-CM

## 2024-04-26 DIAGNOSIS — Z510 Encounter for antineoplastic radiation therapy: Secondary | ICD-10-CM | POA: Diagnosis not present

## 2024-04-26 DIAGNOSIS — E860 Dehydration: Secondary | ICD-10-CM

## 2024-04-26 LAB — RAD ONC ARIA SESSION SUMMARY
Course Elapsed Days: 37
Plan Fractions Treated to Date: 27
Plan Prescribed Dose Per Fraction: 1.8 Gy
Plan Total Fractions Prescribed: 30
Plan Total Prescribed Dose: 54 Gy
Reference Point Dosage Given to Date: 48.6 Gy
Reference Point Session Dosage Given: 1.8 Gy
Session Number: 27

## 2024-04-26 LAB — URINALYSIS, COMPLETE (UACMP) WITH MICROSCOPIC
Bilirubin Urine: NEGATIVE
Glucose, UA: NEGATIVE mg/dL
Ketones, ur: 20 mg/dL — AB
Nitrite: NEGATIVE
Protein, ur: 100 mg/dL — AB
Specific Gravity, Urine: 1.024 (ref 1.005–1.030)
pH: 5 (ref 5.0–8.0)

## 2024-04-26 MED ORDER — CIPROFLOXACIN HCL 500 MG PO TABS: 500.0000 mg | ORAL_TABLET | Freq: Two times a day (BID) | ORAL | 0 refills | Status: AC

## 2024-04-26 NOTE — Telephone Encounter (Signed)
 Pt will be here tomorrow for IVF.

## 2024-04-26 NOTE — Telephone Encounter (Signed)
 I called the patient's wife to review the urinalysis performed today.  The UA showed moderate blood, 21-50 RBCs, 6-10 white blood cells, trace leukocytes, and rare bacteria.  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  The patient is clinically stable and afebrile.  Given the patient being at high risk for serious infection and possible early complicated UTI, would recommend empiric antibiotics with Cipro  500 mg p.o. twice daily for 7 days.  The results of the urine culture are pending.  Encourage oral hydration.  The patient's wife is in agreement with this plan.  He received IV fluids yesterday which was helpful for him.  His UA does show that he is likely dehydrated and he is not hydrating well at home.  They would be interested in coming in tomorrow or Friday after his radiation appointment for additional IV fluids.  I will reach out to help coordinate this appointment.  I reiterated again should he develop any new or worsening symptoms such as fever, rigors, flank pain, worsening dysuria, chills, weakness, abdominal pain, etc. to seek immediate medical evaluation.  She expressed understanding.

## 2024-04-27 ENCOUNTER — Other Ambulatory Visit: Payer: Self-pay

## 2024-04-27 ENCOUNTER — Telehealth: Payer: Self-pay

## 2024-04-27 ENCOUNTER — Ambulatory Visit

## 2024-04-27 ENCOUNTER — Other Ambulatory Visit: Payer: Self-pay | Admitting: Physician Assistant

## 2024-04-27 ENCOUNTER — Ambulatory Visit
Admission: RE | Admit: 2024-04-27 | Discharge: 2024-04-27 | Disposition: A | Discharge status: Home / Self Care | Source: Ambulatory Visit | Attending: Radiation Oncology

## 2024-04-27 DIAGNOSIS — E860 Dehydration: Secondary | ICD-10-CM

## 2024-04-27 DIAGNOSIS — R300 Dysuria: Secondary | ICD-10-CM

## 2024-04-27 DIAGNOSIS — Z510 Encounter for antineoplastic radiation therapy: Secondary | ICD-10-CM | POA: Diagnosis not present

## 2024-04-27 LAB — URINE CULTURE

## 2024-04-27 LAB — RAD ONC ARIA SESSION SUMMARY
Course Elapsed Days: 38
Plan Fractions Treated to Date: 28
Plan Prescribed Dose Per Fraction: 1.8 Gy
Plan Total Fractions Prescribed: 30
Plan Total Prescribed Dose: 54 Gy
Reference Point Dosage Given to Date: 50.4 Gy
Reference Point Session Dosage Given: 1.8 Gy
Session Number: 28

## 2024-04-27 MED ORDER — SODIUM CHLORIDE 0.9 % IV SOLN: Freq: Once | INTRAVENOUS | Status: AC

## 2024-04-27 NOTE — Progress Notes (Signed)
 Pt discharged before second set of VS obtained d/t Pt needing to use the restroom urgently and wanting to leave.

## 2024-04-27 NOTE — Telephone Encounter (Signed)
 Attempted to contact patient regarding urine culture, which was unable to be performed. Informed patient that a repeat urine culture can be done tomorrow while they are here for radiation.  Called Radiation and spoke with [facility] to notify them of the planned Urine culture for tomorrow. Order has been placed.  Left a voicemail for the patient to call back with any questions.

## 2024-04-27 NOTE — Patient Instructions (Signed)

## 2024-04-28 ENCOUNTER — Other Ambulatory Visit: Payer: Self-pay

## 2024-04-28 ENCOUNTER — Ambulatory Visit
Admission: RE | Admit: 2024-04-28 | Discharge: 2024-04-28 | Disposition: A | Discharge status: Home / Self Care | Source: Ambulatory Visit | Attending: Radiation Oncology | Admitting: Radiation Oncology

## 2024-04-28 DIAGNOSIS — R300 Dysuria: Secondary | ICD-10-CM

## 2024-04-28 DIAGNOSIS — Z510 Encounter for antineoplastic radiation therapy: Secondary | ICD-10-CM | POA: Diagnosis not present

## 2024-04-28 LAB — RAD ONC ARIA SESSION SUMMARY
Course Elapsed Days: 39
Plan Fractions Treated to Date: 29
Plan Prescribed Dose Per Fraction: 1.8 Gy
Plan Total Fractions Prescribed: 30
Plan Total Prescribed Dose: 54 Gy
Reference Point Dosage Given to Date: 52.2 Gy
Reference Point Session Dosage Given: 1.8 Gy
Session Number: 29

## 2024-04-29 LAB — URINE CULTURE: Culture: NO GROWTH

## 2024-05-01 ENCOUNTER — Encounter: Payer: Self-pay | Admitting: Hematology

## 2024-05-01 ENCOUNTER — Ambulatory Visit
Admission: RE | Admit: 2024-05-01 | Discharge: 2024-05-01 | Disposition: A | Discharge status: Home / Self Care | Source: Ambulatory Visit | Attending: Radiation Oncology | Admitting: Radiation Oncology

## 2024-05-01 ENCOUNTER — Other Ambulatory Visit: Payer: Self-pay

## 2024-05-01 ENCOUNTER — Other Ambulatory Visit: Payer: Self-pay | Admitting: Radiation Oncology

## 2024-05-01 DIAGNOSIS — Z510 Encounter for antineoplastic radiation therapy: Secondary | ICD-10-CM | POA: Diagnosis not present

## 2024-05-01 LAB — RAD ONC ARIA SESSION SUMMARY
Course Elapsed Days: 42
Plan Fractions Treated to Date: 30
Plan Prescribed Dose Per Fraction: 1.8 Gy
Plan Total Fractions Prescribed: 30
Plan Total Prescribed Dose: 54 Gy
Reference Point Dosage Given to Date: 54 Gy
Reference Point Session Dosage Given: 1.8 Gy
Session Number: 30

## 2024-05-01 MED ORDER — OXYCODONE HCL 5 MG PO TABS: 5.0000 mg | ORAL_TABLET | ORAL | 0 refills | Status: DC | PRN

## 2024-05-02 ENCOUNTER — Other Ambulatory Visit: Payer: Self-pay

## 2024-05-02 ENCOUNTER — Inpatient Hospital Stay

## 2024-05-02 ENCOUNTER — Ambulatory Visit: Admitting: Nurse Practitioner

## 2024-05-02 NOTE — Progress Notes (Deleted)
 [facility]   [phone] [phone]) [phone]    [name], MD as PCP - General (Hematology) [name], [name], RN as Oncology Nurse Navigator   CHIEF COMPLAINT: Follow up anal cancer   Oncology History  Anal cancer [facility])  03/07/2024 Initial Diagnosis   Anal cancer (HCC)   03/07/2024 Cancer Staging   Staging form: Anus, AJCC V9 - Clinical: Stage IIIA (cT3, cN0, cM0) - Signed by [name], MD on 03/20/2024   03/20/2024 -  Chemotherapy   Patient is on Treatment Plan : ANUS Mitomycin  D1,28 + 5FU D1-4, 28-31 q32d        CURRENT THERAPY: S/p concurrent chemoRT with 5FU/mitomycin  03/20/24 - 05/01/24   INTERVAL HISTORY Derek Booker returns for follow up. Seen weekly and completed chemoRT yesterday.   ROS   Past Medical History:  Diagnosis Date  . DVT (deep venous thrombosis) (HCC)   . Hyperlipidemia   . Hypertension   . Seizures (HCC)   . 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 CHOLECYSTECTOMY    . ORTHOPEDIC SURGERY       Outpatient Encounter Medications as of 05/02/2024  Medication Sig  . ciprofloxacin  (CIPRO ) 500 MG tablet Take 1 tablet (500 mg total) by mouth 2 (two) times daily.  . cyanocobalamin (VITAMIN B12) 1000 MCG tablet Take 1,000 mcg by mouth daily.  . diphenoxylate -atropine  (LOMOTIL ) 2.5-0.025 MG tablet TAKE 1 TABLET BY MOUTH EVERY 6 HOURS AS NEEDED FOR DIARRHEA OR LOOSE STOOLS  . diphenoxylate -atropine  (LOMOTIL ) 2.5-0.025 MG tablet Take 1 tablet by mouth every 6 (six) hours as needed for diarrhea or loose stools.  . donepezil (ARICEPT) 10 MG tablet Take 10 mg by mouth daily.  SABRA gabapentin (NEURONTIN) 100 MG capsule Take 100 mg by mouth 3 (three) times daily.  . levothyroxine  (SYNTHROID , LEVOTHROID) 112 MCG tablet Take 112 mcg by mouth daily before breakfast.  . lisinopril (ZESTRIL) 20 MG tablet Take 20 mg by mouth daily.  . magic mouthwash (nystatin , lidocaine , diphenhydrAMINE,  alum & mag hydroxide) suspension Swish & swallow 5 mLs by mouth 4 (four) times daily as needed for mouth pain.  . methylPREDNISolone  (MEDROL  DOSEPAK) 4 MG TBPK tablet Take with signs of chronic sinusitis and take as directed  . mirabegron ER (MYRBETRIQ) 50 MG TB24 tablet Take 50 mg by mouth daily.  SABRA omeprazole (PRILOSEC) 20 MG capsule Take 20 mg by mouth daily.  . ondansetron  (ZOFRAN ) 8 MG tablet Take 1 tablet (8 mg total) by mouth every 8 (eight) hours as needed for nausea or vomiting.  . oxyCODONE  (OXY IR/ROXICODONE ) 5 MG immediate release tablet Take 1-2 tablets (5-10 mg total) by mouth every 4 (four) hours as needed for severe pain (pain score 7-10).  . pravastatin  (PRAVACHOL ) 40 MG tablet Take 40 mg by mouth daily.  . prochlorperazine  (COMPAZINE ) 10 MG tablet Take 1 tablet (10 mg total) by mouth every 6 (six) hours as needed for nausea or vomiting.  . rivaroxaban  (XARELTO ) 20 MG TABS tablet Take 20 mg by mouth daily.  . sertraline  (ZOLOFT ) 50 MG tablet Take 50 mg by mouth daily.  . solifenacin (VESICARE) 5 MG tablet Take 5 mg by mouth daily.  . sucralfate  (CARAFATE ) 1 g tablet Dissolve tablet in 8 oz of water and drink as directed  . SUMAtriptan  (IMITREX ) 100 MG tablet Take 100 mg by mouth every 2 (two) hours as needed for migraine. May  repeat in 2 hours if headache persists or recurs.  . topiramate  (TOPAMAX ) 50 MG tablet Take 50 mg by mouth 2 (two) times daily.   No facility-administered encounter medications on file as of 05/02/2024.     There were no vitals filed for this visit. There is no height or weight on file to calculate BMI.   ECOG PERFORMANCE STATUS: {CHL ONC ECOG [id number]}  PHYSICAL EXAM GENERAL:alert, no distress and comfortable SKIN: no rash  EYES: sclera clear NECK: without mass LYMPH:  no palpable cervical or supraclavicular lymphadenopathy  LUNGS: clear with normal breathing effort HEART: regular rate & rhythm, no lower extremity edema ABDOMEN: abdomen soft,  non-tender and normal bowel sounds NEURO: alert & oriented x 3 with fluent speech, no focal motor/sensory deficits Breast exam:  PAC without erythema    CBC    Latest Ref Rng & Units 04/25/2024    9:25 AM 04/17/2024   12:28 PM 04/11/2024    9:48 AM  CBC  WBC 4.0 - 10.5 K/uL 1.2  1.8  2.1   Hemoglobin 13.0 - 17.0 g/dL 88.3  88.6  88.1   Hematocrit 39.0 - 52.0 % 34.8  33.3  34.6   Platelets 150 - 400 K/uL 146  217  99       CMP     Latest Ref Rng & Units 04/25/2024    9:25 AM 04/17/2024   12:21 PM 04/11/2024    9:48 AM  CMP  Glucose 70 - 99 mg/dL 89  869  899   BUN 8 - 23 mg/dL 18  13  16    Creatinine 0.61 - 1.24 mg/dL 9.04  9.13  9.07   Sodium 135 - 145 mmol/L 139  140  141   Potassium 3.5 - 5.1 mmol/L 3.6  3.7  4.0   Chloride 98 - 111 mmol/L 109  110  111   CO2 22 - 32 mmol/L 23  24  25    Calcium 8.9 - 10.3 mg/dL 9.0  8.8  9.1   Total Protein 6.5 - 8.1 g/dL 7.0  6.8  6.8   Total Bilirubin 0.0 - 1.2 mg/dL 0.8  0.4  0.4   Alkaline Phos 38 - 126 U/L 75  66  61   AST 15 - 41 U/L 19  15  15    ALT 0 - 44 U/L 21  13  12        ASSESSMENT & PLAN:78 year old male   Anal cancer, cT3N0M0 - Patient presented with frequent and loose bowel movements -Colonoscopy showed 2 masses in upper and lower rectum, distal biopsy showed squamous cell carcinoma.  No nodal metastasis on MRI. - PET scan was negative for nodal or distant metastasis -Started concurrent chemoradiation with 5-FU and mitomycin  on 03/20/24 - Nadir plt 25K ANC 0.7 during week 3, week 5 chemo dose reduced. Completed 9/22     PLAN:  No orders of the defined types were placed in this encounter.     All questions were answered. The patient knows to call the clinic with any problems, questions or concerns. No barriers to learning were detected. I spent *** counseling the patient face to face. The total time spent in the appointment was *** and more than 50% was on counseling, review of test results, and coordination of care.    [name], NP 05/02/2024 8:29 AM

## 2024-05-02 NOTE — Radiation Completion Notes (Addendum)
  Radiation Oncology         [phone] ________________________________  Name: Derek Booker MRN: [id number]  Date of Service: 05/01/2024  DOB: [birth year]  End of Treatment Note    Diagnosis:  Squamous Cell Carcinoma of the Anus   Intent: Curative     ==========DELIVERED PLANS==========  First Treatment Date: 2024-03-20 Last Treatment Date: 2024-05-01   Plan Name: Anus Site: Anus Technique: IMRT Mode: Photon Dose Per Fraction: 1.8 Gy Prescribed Dose (Delivered / Prescribed): 54 Gy / 54 Gy Prescribed Fxs (Delivered / Prescribed): 30 / 30     ==========ON TREATMENT VISIT DATES========== 2024-03-24, 2024-03-31, 2024-04-07, 2024-04-14, 2024-04-21, 2024-04-28    See weekly On Treatment Notes in Epic for details in the Media tab (listed as Progress notes on the On Treatment Visit Dates listed above). The patient tolerated radiation but developed significant pain with urinating and having a bowel movement. He also had intact erythema of the skin of the glans penis, groin, and anal verge without blistering at the conclusion of his treatment.    The patient will receive a call in about one month from the radiation oncology department. He will continue follow up with Dr. Lanny as well as Dr. Debby in colorectal surgery.      Donald KYM Husband, PAC

## 2024-05-03 ENCOUNTER — Ambulatory Visit (INDEPENDENT_AMBULATORY_CARE_PROVIDER_SITE_OTHER): Admitting: Otolaryngology

## 2024-05-03 ENCOUNTER — Encounter: Payer: Self-pay | Admitting: Hematology

## 2024-05-03 ENCOUNTER — Telehealth: Payer: Self-pay | Admitting: Nurse Practitioner

## 2024-05-03 NOTE — Telephone Encounter (Signed)
 Called pt and scheduled him for Friday and he is aware of his appt.

## 2024-05-04 ENCOUNTER — Other Ambulatory Visit: Payer: Self-pay

## 2024-05-04 ENCOUNTER — Other Ambulatory Visit: Payer: Self-pay | Admitting: Nurse Practitioner

## 2024-05-04 DIAGNOSIS — R300 Dysuria: Secondary | ICD-10-CM

## 2024-05-04 NOTE — Assessment & Plan Note (Addendum)
 cT3N0M0 - Patient presented with frequent and loose bowel movements -Colonoscopy showed 2 masses in upper and lower rectum, post biopsy showed squamous cell carcinoma.  No nodal metastasis on MRI. - PET scan was negative for nodal or distant metastasis -He started concurrent chemoradiation with 5-FU and mitomycin  on March 20, 2024 -- Final dose chemotherapy on 04/21/2024. --Final dose radiation on 05/01/2024.

## 2024-05-04 NOTE — Progress Notes (Signed)
 [name], MD as PCP - General (Hematology) [name] [name], RN as Oncology Nurse Navigator  Clinic Day:  05/05/2024  Referring physician: [name], MD  ASSESSMENT & PLAN:   Assessment & Plan: Anal cancer [facility]) cT3N0M0 - Patient presented with frequent and loose bowel movements -Colonoscopy showed 2 masses in upper and lower rectum, post biopsy showed squamous cell carcinoma.  No nodal metastasis on MRI. - PET scan was negative for nodal or distant metastasis -He started concurrent chemoradiation with 5-FU and mitomycin  on March 20, 2024 -- Final dose chemotherapy on 04/21/2024. --Final dose radiation on 05/01/2024.   Rash Located around genitalia and anus, stretching up into the gluteal fold and above coccyx.  Rash is red and inflamed.  There is evidence of peeling skin around the the penis and in the groin.  Two, small open lesions, 1 on each of the buttocks, showing evidence of draining serosanguineous fluid.  Prescribed pain medicine per radiation oncology.  Has thus far, been reluctant to use.  Recommended he start to use this to help control pain and allow him to rest.  He did not pick up initial prescription.  Sent new prescription for oxycodone  to his pharmacy with updated instructions.  Rash is causing pain with urination and especially pain with bowel movements.  Recommend he use MiraLAX to help loosen stool and prevent straining or constipation.  Advised him to steer away from fiber supplements right now as this will bulk up the stool and potentially make bowel movements more difficult to pass.  Recommend he contact provider and radiation oncology to inquire about cream/gel that can be used topically to treat severe pain he is experiencing due to effects of radiation.  I will also send message to [place], [place], for her recommendation.  UTI Urine sample from earlier this week showing bacteria in his urine.  Was started on Cipro  500 mg twice daily.  Recommend he  take until finished.  Thrombocytopenia Labs indicate platelet level at 74.  This is likely response to concurrent chemoradiation.  Last dose of radiation was 05/01/2024.  No requirement for treatment at this time.  To follow-up again in 1 week with recheck of labs and ensure improvement.  Plan Reviewed labs. - CBC showing mild leukopenia and moderate to severe thrombocytopenia.  Likely responses to concurrent chemoradiation which she finished 05/01/2024. - CMP unremarkable. Recommend completion of previously prescribed Cipro  twice daily for 7 days for possible UTI. New prescription for oxycodone  sent to his pharmacy.  Strongly encouraged him to take this as prescribed and as needed due to severity of pain he is experiencing. MiraLAX recommended to soften stool and prevent constipation and/or bowel obstruction. Encouraged him to contact radiation oncology for cream or gel which may help with pain he is experiencing due to radiation effects.  I will also reach out to PA, Donald, for recommendations. Recheck labs and follow-up in 1 week.   The patient understands the plans discussed today and is in agreement with them.  He knows to contact our office if he develops concerns prior to his next appointment.  I provided 30 minutes of face-to-face time during this encounter and > 50% was spent counseling as documented under my assessment and plan.    [name], NP  [facility] - A DEPT OF [facility] [address] [place] [address] Dept: [phone] Dept Fax: [phone]   No orders of  the defined types were placed in this encounter.     CHIEF COMPLAINT:  CC: Anal cancer  Current Treatment: Concurrent chemoradiation with mitomycin  and 5-FU; last dose chemotherapy 04/21/2024.  Last dose of radiation on 05/01/2024  INTERVAL HISTORY:  Yaron is here today for repeat clinical assessment.  He last saw [place], [place], on  04/25/2024.  His visit was for symptom management.  He had been having severe rectal pain and rash.  Rash located around the genitalia and anus causing significant pain during urination and bowel movements.  He also has been having diarrhea.  He was treated for urinary tract infection with Cipro  500 mg twice daily for 7 days.  He states he is still not taking pain medication due to fear of side effects and dependence.  He is having a difficult time eating as each time he eats, he feels cramping with diarrhea.  Trying to prevent that if well.  He denies nausea and vomiting.  Denies chest pain, chest pressure, shortness of breath.  He denies fevers, chills, or night sweats.  His appetite is poor. His weight has decreased 6 pounds over last week.  I have reviewed the past medical history, past surgical history, social history and family history with the patient and they are unchanged from previous note.  ALLERGIES:  is allergic to nitrates, organic; chocolate; and sulfites.  MEDICATIONS:  Current Outpatient Medications  Medication Sig Dispense Refill   ciprofloxacin  (CIPRO ) 500 MG tablet Take 1 tablet (500 mg total) by mouth 2 (two) times daily. 14 tablet 0   cyanocobalamin (VITAMIN B12) 1000 MCG tablet Take 1,000 mcg by mouth daily.     diphenoxylate -atropine  (LOMOTIL ) 2.5-0.025 MG tablet TAKE 1 TABLET BY MOUTH EVERY 6 HOURS AS NEEDED FOR DIARRHEA OR LOOSE STOOLS 30 tablet 1   diphenoxylate -atropine  (LOMOTIL ) 2.5-0.025 MG tablet Take 1 tablet by mouth every 6 (six) hours as needed for diarrhea or loose stools. 30 tablet 0   donepezil (ARICEPT) 10 MG tablet Take 10 mg by mouth daily.     gabapentin (NEURONTIN) 100 MG capsule Take 100 mg by mouth 3 (three) times daily.     levothyroxine  (SYNTHROID , LEVOTHROID) 112 MCG tablet Take 112 mcg by mouth daily before breakfast.     lisinopril (ZESTRIL) 20 MG tablet Take 20 mg by mouth daily.     magic mouthwash (nystatin , lidocaine , diphenhydrAMINE, alum & mag  hydroxide) suspension Swish & swallow 5 mLs by mouth 4 (four) times daily as needed for mouth pain. 140 mL 1   methylPREDNISolone  (MEDROL  DOSEPAK) 4 MG TBPK tablet Take with signs of chronic sinusitis and take as directed 1 each 1   mirabegron ER (MYRBETRIQ) 50 MG TB24 tablet Take 50 mg by mouth daily.     omeprazole (PRILOSEC) 20 MG capsule Take 20 mg by mouth daily.     ondansetron  (ZOFRAN ) 8 MG tablet Take 1 tablet (8 mg total) by mouth every 8 (eight) hours as needed for nausea or vomiting. 30 tablet 2   oxyCODONE  (OXY IR/ROXICODONE ) 5 MG immediate release tablet Take 1-2 tablets (5-10 mg total) by mouth every 4 (four) hours as needed for severe pain (pain score 7-10). 120 tablet 0   pravastatin  (PRAVACHOL ) 40 MG tablet Take 40 mg by mouth daily.     prochlorperazine  (COMPAZINE ) 10 MG tablet Take 1 tablet (10 mg total) by mouth every 6 (six) hours as needed for nausea or vomiting. 30 tablet 1   rivaroxaban  (XARELTO ) 20 MG TABS tablet Take 20 mg  by mouth daily.     sertraline  (ZOLOFT ) 50 MG tablet Take 50 mg by mouth daily.     solifenacin (VESICARE) 5 MG tablet Take 5 mg by mouth daily.     sucralfate  (CARAFATE ) 1 g tablet Dissolve tablet in 8 oz of water and drink as directed 28 tablet 0   SUMAtriptan  (IMITREX ) 100 MG tablet Take 100 mg by mouth every 2 (two) hours as needed for migraine. May repeat in 2 hours if headache persists or recurs.     topiramate  (TOPAMAX ) 50 MG tablet Take 50 mg by mouth 2 (two) times daily.     No current facility-administered medications for this visit.    HISTORY OF PRESENT ILLNESS:   Oncology History  Anal cancer (HCC)  03/07/2024 Initial Diagnosis   Anal cancer (HCC)   03/07/2024 Cancer Staging   Staging form: Anus, AJCC V9 - Clinical: Stage IIIA (cT3, cN0, cM0) - Signed by [name], MD on 03/20/2024   03/20/2024 -  Chemotherapy   Patient is on Treatment Plan : ANUS Mitomycin  D1,28 + 5FU D1-4, 28-31 q32d         REVIEW OF SYSTEMS:    Constitutional: Denies fevers or chills.  Appetite very poor.  Has lost 6 pounds over the last week. Eyes: Denies blurriness of vision Ears, nose, mouth, throat, and face: Denies mucositis or sore throat Respiratory: Denies cough, dyspnea or wheezes Cardiovascular: Denies palpitation, chest discomfort or lower extremity swelling Gastrointestinal:  Denies nausea, heartburn or change in bowel habits.  Bowel movements are extremely painful due to radiation effects of the skin of the rectal area. Skin: Redness and rash over the genitalia, gluteal folds, buttocks, and stretching are past sacrum and coccyx. Lymphatics: Denies new lymphadenopathy or easy bruising Neurological:Denies numbness, tingling or new weaknesses Behavioral/Psych: Mood is stable, no new changes  All other systems were reviewed with the patient and are negative.   VITALS:   Today's Vitals   05/05/24 1001  BP: 94/60  Pulse: 77  Resp: 16  Temp: 97.9 F (36.6 C)  TempSrc: Temporal  SpO2: 99%  Weight: 150 lb 4.8 oz (68.2 kg)  Height: 6' (1.829 m)   Body mass index is 20.38 kg/m.   Wt Readings from Last 3 Encounters:  05/05/24 150 lb 4.8 oz (68.2 kg)  04/27/24 156 lb 3 oz (70.8 kg)  04/25/24 155 lb (70.3 kg)    Body mass index is 20.38 kg/m.  Performance status (ECOG): 2 - Symptomatic, <50% confined to bed  PHYSICAL EXAM:   GENERAL:alert.  Patient appears extremely uncomfortable and in moderate distress due to pain.  Having trouble sitting  with weight on the buttocks.  Even light touch is extremely painful on all affected areas and skin. SKIN: rash located around genitalia and anus, stretching up into the gluteal fold and above coccyx and gram.  Rash is red and inflamed.  There is evidence of peeling skin around the the penis and in the groin.  There are 2 areas on each side of the gluteal fold which are open with scabbing and evidence of drainage.  Fluid appears serosanguineous in nature.   EYES: normal,  Conjunctiva are pink and non-injected, sclera clear OROPHARYNX:no exudate, no erythema and lips, buccal mucosa, and tongue normal  NECK: supple, thyroid normal size, non-tender, without nodularity LYMPH:  no palpable lymphadenopathy in the cervical, axillary or inguinal LUNGS: clear to auscultation and percussion with normal breathing effort HEART: regular rate & rhythm and no murmurs and no  lower extremity edema ABDOMEN:abdomen soft, non-tender and normal bowel sounds Musculoskeletal:no cyanosis of digits and no clubbing  NEURO: alert & oriented x 3 with fluent speech, no focal motor/sensory deficits  LABORATORY DATA:  I have reviewed the data as listed    Component Value Date/Time   NA 140 05/05/2024 0943   K 3.8 05/05/2024 0943   CL 104 05/05/2024 0943   CO2 22 05/05/2024 0943   GLUCOSE 86 05/05/2024 0943   BUN 12 05/05/2024 0943   CREATININE 1.04 05/05/2024 0943   CALCIUM 9.2 05/05/2024 0943   PROT 6.7 05/05/2024 0943   ALBUMIN 3.4 (L) 05/05/2024 0943   AST 18 05/05/2024 0943   ALT 13 05/05/2024 0943   ALKPHOS 65 05/05/2024 0943   BILITOT 0.5 05/05/2024 0943   GFRNONAA >60 05/05/2024 0943   GFRAA >60 12/03/2016 0457    Lab Results  Component Value Date   WBC 3.2 (L) 05/05/2024   NEUTROABS 2.3 05/05/2024   HGB 11.2 (L) 05/05/2024   HCT 32.3 (L) 05/05/2024   MCV 96.4 05/05/2024   PLT 74 (L) 05/05/2024    RADIOGRAPHIC STUDIES: IR PICC PLACEMENT RIGHT >5 YRS INC IMG GUIDE Result Date: 04/17/2024 INDICATION: Hx of anal cancer. IR consulted for PICC placement for chemotherapy. EXAM: ULTRASOUND AND FLUOROSCOPIC GUIDED PICC LINE INSERTION MEDICATIONS: 3 mL 1% lidocaine  CONTRAST:  None FLUOROSCOPY TIME:  Radiation Exposure Index (as provided by the fluoroscopic device): 1.4 mGy Kerma COMPLICATIONS: None immediate. TECHNIQUE: The procedure, risks, benefits, and alternatives were explained to the patient and informed written consent was obtained. A timeout was performed prior to  the initiation of the procedure. The right upper extremity was prepped with chlorhexidine in a sterile fashion, and a sterile drape was applied covering the operative field. Maximum barrier sterile technique with sterile gowns and gloves were used for the procedure. A timeout was performed prior to the initiation of the procedure. Local anesthesia was provided with 1% lidocaine . Under direct ultrasound guidance, the brachial vein was accessed with a micropuncture kit after the overlying soft tissues were anesthetized with 1% lidocaine . Real-time ultrasound guidance was utilized for vascular access including the acquisition of a permanent ultrasound image documenting patency of the accessed vessel. A guidewire was advanced to the level of the superior caval-atrial junction for measurement purposes and the PICC line was cut to length. A peel-away sheath was placed and a 36 cm, 5 [place], dual lumen was inserted to level of the superior caval-atrial junction. A post procedure spot fluoroscopic was obtained. The catheter easily aspirated and flushed and was secured in place with stat lock device. A dressing was applied. The patient tolerated the procedure well without immediate post procedural complication. FINDINGS: After catheter placement, the tip lies within the superior cavoatrial junction. The catheter aspirates and flushes normally and is ready for immediate use. IMPRESSION: Successful ultrasound and fluoroscopic guided placement of a right brachial vein approach, 36 cm, 5 French, dual lumen PICC with tip at the superior caval-atrial junction. The PICC line is ready for immediate use. Performed by: Wyatt Pommier, PA-C Electronically Signed   By: Juliene Balder M.D.   On: 04/17/2024 11:01

## 2024-05-05 ENCOUNTER — Inpatient Hospital Stay

## 2024-05-05 ENCOUNTER — Inpatient Hospital Stay: Admitting: Nurse Practitioner

## 2024-05-05 VITALS — BP 94/60 | HR 77 | Temp 97.9°F | Resp 16 | Ht 72.0 in | Wt 150.3 lb

## 2024-05-05 DIAGNOSIS — Z510 Encounter for antineoplastic radiation therapy: Secondary | ICD-10-CM | POA: Diagnosis not present

## 2024-05-05 DIAGNOSIS — R300 Dysuria: Secondary | ICD-10-CM

## 2024-05-05 DIAGNOSIS — C210 Malignant neoplasm of anus, unspecified: Secondary | ICD-10-CM

## 2024-05-05 LAB — CBC WITH DIFFERENTIAL (CANCER CENTER ONLY)
Abs Immature Granulocytes: 0.07 K/uL (ref 0.00–0.07)
Basophils Absolute: 0 K/uL (ref 0.0–0.1)
Basophils Relative: 1 %
Eosinophils Absolute: 0 K/uL (ref 0.0–0.5)
Eosinophils Relative: 1 %
HCT: 32.3 % — ABNORMAL LOW (ref 39.0–52.0)
Hemoglobin: 11.2 g/dL — ABNORMAL LOW (ref 13.0–17.0)
Immature Granulocytes: 2 %
Lymphocytes Relative: 5 %
Lymphs Abs: 0.2 K/uL — ABNORMAL LOW (ref 0.7–4.0)
MCH: 33.4 pg (ref 26.0–34.0)
MCHC: 34.7 g/dL (ref 30.0–36.0)
MCV: 96.4 fL (ref 80.0–100.0)
Monocytes Absolute: 0.6 K/uL (ref 0.1–1.0)
Monocytes Relative: 18 %
Neutro Abs: 2.3 K/uL (ref 1.7–7.7)
Neutrophils Relative %: 73 %
Platelet Count: 74 K/uL — ABNORMAL LOW (ref 150–400)
RBC: 3.35 MIL/uL — ABNORMAL LOW (ref 4.22–5.81)
RDW: 15.9 % — ABNORMAL HIGH (ref 11.5–15.5)
WBC Count: 3.2 K/uL — ABNORMAL LOW (ref 4.0–10.5)
nRBC: 0 % (ref 0.0–0.2)

## 2024-05-05 LAB — CMP (CANCER CENTER ONLY)
ALT: 13 U/L (ref 0–44)
AST: 18 U/L (ref 15–41)
Albumin: 3.4 g/dL — ABNORMAL LOW (ref 3.5–5.0)
Alkaline Phosphatase: 65 U/L (ref 38–126)
Anion gap: 14 (ref 5–15)
BUN: 12 mg/dL (ref 8–23)
CO2: 22 mmol/L (ref 22–32)
Calcium: 9.2 mg/dL (ref 8.9–10.3)
Chloride: 104 mmol/L (ref 98–111)
Creatinine: 1.04 mg/dL (ref 0.61–1.24)
GFR, Estimated: >60 mL/min (ref >60)
Glucose, Bld: 86 mg/dL (ref 70–99)
Potassium: 3.8 mmol/L (ref 3.5–5.1)
Sodium: 140 mmol/L (ref 135–145)
Total Bilirubin: 0.5 mg/dL (ref 0.0–1.2)
Total Protein: 6.7 g/dL (ref 6.5–8.1)

## 2024-05-05 MED ORDER — OXYCODONE HCL 5 MG PO TABS: 5.0000 mg | ORAL_TABLET | ORAL | 0 refills | Status: DC | PRN

## 2024-05-08 ENCOUNTER — Ambulatory Visit (HOSPITAL_COMMUNITY)
Admission: RE | Admit: 2024-05-08 | Discharge: 2024-05-08 | Disposition: A | Discharge status: Home / Self Care | Source: Ambulatory Visit | Attending: Cardiology | Admitting: Cardiology

## 2024-05-08 ENCOUNTER — Other Ambulatory Visit: Payer: Self-pay

## 2024-05-08 ENCOUNTER — Ambulatory Visit
Admission: RE | Admit: 2024-05-08 | Discharge: 2024-05-08 | Disposition: A | Discharge status: Home / Self Care | Source: Ambulatory Visit | Attending: Radiation Oncology | Admitting: Radiation Oncology

## 2024-05-08 DIAGNOSIS — E039 Hypothyroidism, unspecified: Secondary | ICD-10-CM | POA: Diagnosis not present

## 2024-05-08 DIAGNOSIS — R569 Unspecified convulsions: Secondary | ICD-10-CM | POA: Diagnosis not present

## 2024-05-08 DIAGNOSIS — R079 Chest pain, unspecified: Secondary | ICD-10-CM | POA: Diagnosis not present

## 2024-05-08 DIAGNOSIS — Z86718 Personal history of other venous thrombosis and embolism: Secondary | ICD-10-CM | POA: Insufficient documentation

## 2024-05-08 DIAGNOSIS — C210 Malignant neoplasm of anus, unspecified: Secondary | ICD-10-CM

## 2024-05-08 DIAGNOSIS — Z7969 Long term (current) use of other immunomodulators and immunosuppressants: Secondary | ICD-10-CM | POA: Diagnosis not present

## 2024-05-08 DIAGNOSIS — I440 Atrioventricular block, first degree: Secondary | ICD-10-CM | POA: Diagnosis not present

## 2024-05-08 DIAGNOSIS — I10 Essential (primary) hypertension: Secondary | ICD-10-CM | POA: Diagnosis not present

## 2024-05-08 DIAGNOSIS — I351 Nonrheumatic aortic (valve) insufficiency: Secondary | ICD-10-CM | POA: Insufficient documentation

## 2024-05-08 DIAGNOSIS — E785 Hyperlipidemia, unspecified: Secondary | ICD-10-CM | POA: Diagnosis not present

## 2024-05-08 DIAGNOSIS — Z9221 Personal history of antineoplastic chemotherapy: Secondary | ICD-10-CM | POA: Insufficient documentation

## 2024-05-08 DIAGNOSIS — R42 Dizziness and giddiness: Secondary | ICD-10-CM | POA: Insufficient documentation

## 2024-05-08 DIAGNOSIS — Z510 Encounter for antineoplastic radiation therapy: Secondary | ICD-10-CM | POA: Diagnosis not present

## 2024-05-08 MED ORDER — SILVER SULFADIAZINE 1 % EX CREA: TOPICAL_CREAM | Freq: Once | CUTANEOUS | Status: AC

## 2024-05-08 MED ORDER — SILVER SULFADIAZINE 1 % EX CREA: TOPICAL_CREAM | Freq: Once | CUTANEOUS | Status: DC

## 2024-05-08 NOTE — Addendum Note (Signed)
 Encounter addended by: Janice Lynwood BROCKS on: 05/08/2024 11:10 AM  Actions taken: Imaging Exam ended

## 2024-05-09 LAB — ECHOCARDIOGRAM COMPLETE
Area-P 1/2: 2.26 cm2
S' Lateral: 2.6 cm

## 2024-05-10 ENCOUNTER — Ambulatory Visit: Payer: Self-pay | Admitting: Cardiology

## 2024-05-11 ENCOUNTER — Other Ambulatory Visit: Payer: Self-pay | Admitting: Nurse Practitioner

## 2024-05-11 DIAGNOSIS — C210 Malignant neoplasm of anus, unspecified: Secondary | ICD-10-CM

## 2024-05-11 NOTE — Assessment & Plan Note (Signed)
 cT3N0M0 - Patient presented with frequent and loose bowel movements -Colonoscopy showed 2 masses in upper and lower rectum, post biopsy showed squamous cell carcinoma.  No nodal metastasis on MRI. - PET scan was negative for nodal or distant metastasis -He started concurrent chemoradiation with 5-FU and mitomycin  on March 20, 2024 -- Final dose chemotherapy on 04/21/2024. --Final dose radiation on 05/01/2024.

## 2024-05-11 NOTE — Progress Notes (Unsigned)
 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  No nodal metastasis on MRI. - PET scan was negative for nodal or distant metastasis -He started concurrent chemoradiation with 5-FU and mitomycin  on March 20, 2024 -- Final dose chemotherapy on 04/21/2024. --Final dose radiation on 05/01/2024.     Mild anemia Mild anemia with Hgb 10.5 and HCT 31.2.  Will continue to monitor closely and treat with IV iron and/or blood if indicated.  Leukopenia Mild and stable.  Will continue to monitor closely.  Thrombocytopenia Improved from last week.  Platelet count now 142, up from 72 on 05/05/2024.  Will continue to monitor closely.  Rash Located around genitalia and anus, stretching up into the gluteal fold and above coccyx.  Rash is red and inflamed.  There is evidence of peeling skin around the the penis and in the groin.  Previously open areas along both sides of the gluteal fold are healing, dried, showing evidence of peeling.  There is no new areas of rash or inflammation.  He was given Silvadene cream per radiology oncology on Monday.  Has just started using this.  He states this has helped the pain significantly.  Continues to take pain medication when needed.  May need refills of both Silvadene and pain medication before next visit.  Will refill when needed.  Plan Labs reviewed. -Mild anemia.  Stable leukopenia.  Improved thrombocytopenia. - Unremarkable CMP. Improved condition of the skin around genitalia, gluteal folds, and coccyx.  Continue using Silvadene cream as instructed by radiology oncology.  Will refill this and pain medication  when needed. Plan to repeat labs and follow-up in 2 weeks.   The patient understands the plans discussed today and is in agreement with them.  He knows to contact our office if he develops concerns prior to his next appointment.  I provided 25 minutes of face-to-face time during this encounter and > 50% was spent counseling as documented under my assessment and plan.    Derek FORBES Lessen, NP  [facility] - A DEPT OF [facility] [address] [place] [address] Dept: [phone] Dept Fax: [phone]   No orders of the defined types were placed in this encounter.     CHIEF COMPLAINT:  CC: Anal cancer  Current Treatment: Completed concurrent chemoradiation on 05/01/2024  INTERVAL HISTORY:  Derek Booker is here today for repeat clinical assessment.  He last saw me on 05/05/2024.  He had significant rash, redness, and pain around the genitalia up into the gluteal fold, extending above the buttocks and coccyx. There were no distinct lesions.  Skin was intact. Some white appearing discharge was noted at the anus along with small, irritated external hemorrhoids.  He did see radiology oncology earlier this week, was given prescription for Silvadene cream.  He reports using Silvadene cream as extremely beneficial to pain in affected areas.  He is taking previously prescribed pain medications as needed.  He is taking it more on scheduled basis to prevent pain from becoming even more severe.  States he is having to apply Silvadene cream at times during the day  as it does come off easily onto clothing or towels.  Only using a little bit at a time as that is all he needs to improve symptoms.  He does feel fatigued as he is not sleeping very well.  This is mostly due to pain he is experiencing.  He denies chest pain, chest pressure, or shortness of breath. He denies headaches or visual disturbances. He denies abdominal pain, nausea, vomiting,  or changes in bowel or bladder habits.   He denies fevers or chills. His appetite is good. His weight has increased 5 pounds over last week.  I have reviewed the past medical history, past surgical history, social history and family history with the patient and they are unchanged from previous note.  ALLERGIES:  is allergic to nitrates, organic; chocolate; and sulfites.  MEDICATIONS:  Current Outpatient Medications  Medication Sig Dispense Refill   ciprofloxacin  (CIPRO ) 500 MG tablet Take 1 tablet (500 mg total) by mouth 2 (two) times daily. 14 tablet 0   cyanocobalamin (VITAMIN B12) 1000 MCG tablet Take 1,000 mcg by mouth daily.     diphenoxylate -atropine  (LOMOTIL ) 2.5-0.025 MG tablet TAKE 1 TABLET BY MOUTH EVERY 6 HOURS AS NEEDED FOR DIARRHEA OR LOOSE STOOLS 30 tablet 1   diphenoxylate -atropine  (LOMOTIL ) 2.5-0.025 MG tablet Take 1 tablet by mouth every 6 (six) hours as needed for diarrhea or loose stools. 30 tablet 0   Docusate Sodium (DSS) 100 MG CAPS 1 capsule as needed Orally Once a day; Duration: 30 day(s)     donepezil (ARICEPT) 10 MG tablet Take 10 mg by mouth daily.     gabapentin (NEURONTIN) 100 MG capsule Take 100 mg by mouth 3 (three) times daily.     levothyroxine  (SYNTHROID , LEVOTHROID) 112 MCG tablet Take 112 mcg by mouth daily before breakfast.     lisinopril (ZESTRIL) 20 MG tablet Take 20 mg by mouth daily.     magic mouthwash (nystatin , lidocaine , diphenhydrAMINE, alum & mag hydroxide) suspension Swish & swallow 5 mLs by mouth 4 (four) times daily as needed for mouth pain. 140 mL 1   methylPREDNISolone  (MEDROL  DOSEPAK) 4 MG TBPK tablet Take with signs of chronic sinusitis and take as directed 1 each 1   mirabegron ER (MYRBETRIQ) 50 MG TB24 tablet Take 50 mg by mouth daily.     omeprazole (PRILOSEC) 20 MG capsule Take 20 mg by mouth daily.     ondansetron  (ZOFRAN ) 8 MG tablet Take 1 tablet (8 mg total) by mouth every 8 (eight) hours as needed for nausea or vomiting. 30 tablet 2    oxyCODONE  (OXY IR/ROXICODONE ) 5 MG immediate release tablet Take 1-2 tablets (5-10 mg total) by mouth every 4 (four) hours as needed for severe pain (pain score 7-10). 120 tablet 0   pravastatin  (PRAVACHOL ) 40 MG tablet Take 40 mg by mouth daily.     prochlorperazine  (COMPAZINE ) 10 MG tablet Take 1 tablet (10 mg total) by mouth every 6 (six) hours as needed for nausea or vomiting. 30 tablet 1   rivaroxaban  (XARELTO ) 20 MG TABS tablet Take 20 mg by mouth daily.     sertraline  (ZOLOFT ) 50 MG tablet Take 50 mg by mouth daily.     solifenacin (VESICARE) 5 MG tablet Take 5 mg by mouth daily.     sucralfate  (CARAFATE ) 1 g tablet Dissolve tablet in 8 oz of water and drink as directed 28 tablet 0   SUMAtriptan  (IMITREX ) 100 MG tablet Take 100 mg by mouth every 2 (two) hours as needed  for migraine. May repeat in 2 hours if headache persists or recurs.     topiramate  (TOPAMAX ) 50 MG tablet Take 50 mg by mouth 2 (two) times daily.     No current facility-administered medications for this visit.    HISTORY OF PRESENT ILLNESS:   Oncology History  Anal cancer (HCC)  03/07/2024 Initial Diagnosis   Anal cancer (HCC)   03/07/2024 Cancer Staging   Staging form: Anus, AJCC V9 - Clinical: Stage IIIA (cT3, cN0, cM0) - Signed by [name], MD on 03/20/2024   03/20/2024 -  Chemotherapy   Patient is on Treatment Plan : ANUS Mitomycin  D1,28 + 5FU D1-4, 28-31 q32d         REVIEW OF SYSTEMS:   Constitutional: Denies fevers, chills or abnormal weight loss Eyes: Denies blurriness of vision Ears, nose, mouth, throat, and face: Denies mucositis or sore throat Respiratory: Denies cough, dyspnea or wheezes Cardiovascular: Denies palpitation, chest discomfort or lower extremity swelling Gastrointestinal:  Denies nausea, heartburn or change in bowel habits Skin: Severe skin rash around genitalia, in the groin area, along the gluteal folds, around the anus, and extending above buttocks and coccyx.  Continues to be  very painful.  Some improvement since adding Silvadene cream. Lymphatics: Denies new lymphadenopathy or easy bruising Neurological:Denies numbness, tingling or new weaknesses Behavioral/Psych: Mood is stable, no new changes  All other systems were reviewed with the patient and are negative.   VITALS:   Today's Vitals   05/12/24 0912 05/12/24 0940  BP: 116/78   Pulse: 66   Resp: 17   Temp: (!) 97.2 F (36.2 C)   SpO2: 100%   Weight: 155 lb 6.4 oz (70.5 kg)   Height: 6' (1.829 m)   PainSc: 7  0-No pain   Body mass index is 21.08 kg/m.   Wt Readings from Last 3 Encounters:  05/12/24 155 lb 6.4 oz (70.5 kg)  05/05/24 150 lb 4.8 oz (68.2 kg)  04/27/24 156 lb 3 oz (70.8 kg)    Body mass index is 21.08 kg/m.  Performance status (ECOG): 2 - Symptomatic, <50% confined to bed  PHYSICAL EXAM:   GENERAL:alert, no distress and comfortable SKIN: rash located around genitalia and anus, stretching up into the gluteal fold and above coccyx.  Rash is red and inflamed.  There is evidence of peeling skin around the the penis and in the groin.  Previously open areas along both sides of the gluteal fold are healing, dried, showing evidence of peeling.  There is no new areas of rash or inflammation. EYES: normal, Conjunctiva are pink and non-injected, sclera clear OROPHARYNX:no exudate, no erythema and lips, buccal mucosa, and tongue normal  NECK: supple, thyroid normal size, non-tender, without nodularity LYMPH:  no palpable lymphadenopathy in the cervical, axillary or inguinal LUNGS: clear to auscultation and percussion with normal breathing effort HEART: regular rate & rhythm and no murmurs and no lower extremity edema ABDOMEN:abdomen soft, non-tender and normal bowel sounds Musculoskeletal:no cyanosis of digits and no clubbing  NEURO: alert & oriented x 3 with fluent speech, no focal motor/sensory deficits  LABORATORY DATA:  I have reviewed the data as listed    Component Value  Date/Time   NA 141 05/12/2024 0852   K 4.2 05/12/2024 0852   CL 109 05/12/2024 0852   CO2 28 05/12/2024 0852   GLUCOSE 95 05/12/2024 0852   BUN 11 05/12/2024 0852   CREATININE 0.96 05/12/2024 0852   CALCIUM 9.6 05/12/2024 0852   PROT 6.5 05/12/2024  9147   ALBUMIN 3.2 (L) 05/12/2024 0852   AST 15 05/12/2024 0852   ALT 10 05/12/2024 0852   ALKPHOS 58 05/12/2024 0852   BILITOT 0.4 05/12/2024 0852   GFRNONAA >60 05/12/2024 0852   GFRAA >60 12/03/2016 0457    Lab Results  Component Value Date   WBC 3.2 (L) 05/12/2024   NEUTROABS 2.2 05/12/2024   HGB 10.5 (L) 05/12/2024   HCT 31.2 (L) 05/12/2024   MCV 101.0 (H) 05/12/2024   PLT 142 (L) 05/12/2024   RADIOGRAPHIC STUDIES: ECHOCARDIOGRAM COMPLETE Result Date: 05/09/2024    ECHOCARDIOGRAM REPORT   Patient Name:   Derek Booker    Date of Exam: 05/08/2024 Medical Rec #:  [id number]     Height:       72.0 in Accession #:    [id number]    Weight:       150.3 lb Date of Birth:  January 12, 1946     BSA:          1.887 m Patient Age:    78 years      BP:           125/83 mmHg Patient Gender: M             HR:           65 bpm. Exam Location:  [address] Procedure: 2D Echo, 3D Echo and Strain Analysis (Both Spectral and Color Flow            Doppler were utilized during procedure). Indications:    Z09 Chemotherapy  History:        Patient has no prior history of Echocardiogram examinations.                 Signs/Symptoms:Chest Pain; Risk Factors:Hypertension and                 Dyslipidemia. S/P administration of cardiotoxic chemotherapy.                 First degree AV block. Hypothyroidism. Dizziness. Seizures. DVT.  Sonographer:    Jon Hacker RCS Referring Phys: [id number] CHRISTOPHER L SCHUMANN IMPRESSIONS  1. Left ventricular ejection fraction, by estimation, is 60 to 65%. Left ventricular ejection fraction by 3D volume is 60 %. The left ventricle has normal function. The left ventricle has no regional wall motion abnormalities. Left ventricular  diastolic  parameters are consistent with Grade I diastolic dysfunction (impaired relaxation). The average left ventricular global longitudinal strain is -22.5 %. The global longitudinal strain is normal.  2. Right ventricular systolic function is normal. The right ventricular size is normal.  3. The mitral valve is normal in structure. Trivial mitral valve regurgitation. No evidence of mitral stenosis.  4. The aortic valve is tricuspid. There is mild calcification of the aortic valve. Aortic valve regurgitation is mild. Aortic valve sclerosis/calcification is present, without any evidence of aortic stenosis. FINDINGS  Left Ventricle: Left ventricular ejection fraction, by estimation, is 60 to 65%. Left ventricular ejection fraction by 3D volume is 60 %. The left ventricle has normal function. The left ventricle has no regional wall motion abnormalities. The average left ventricular global longitudinal strain is -22.5 %. Strain was performed and the global longitudinal strain is normal. The left ventricular internal cavity size was normal in size. There is no left ventricular hypertrophy. Left ventricular diastolic parameters are consistent with Grade I diastolic dysfunction (impaired relaxation). Right Ventricle: The right ventricular size is normal. No increase in right ventricular wall thickness.  Right ventricular systolic function is normal. Left Atrium: Left atrial size was normal in size. Right Atrium: Right atrial size was normal in size. Pericardium: There is no evidence of pericardial effusion. Mitral Valve: The mitral valve is normal in structure. Trivial mitral valve regurgitation. No evidence of mitral valve stenosis. Tricuspid Valve: The tricuspid valve is normal in structure. Tricuspid valve regurgitation is trivial. No evidence of tricuspid stenosis. Aortic Valve: The aortic valve is tricuspid. There is mild calcification of the aortic valve. Aortic valve regurgitation is mild. Aortic valve  sclerosis/calcification is present, without any evidence of aortic stenosis. Pulmonic Valve: The pulmonic valve was normal in structure. Pulmonic valve regurgitation is mild. No evidence of pulmonic stenosis. Aorta: The aortic root is normal in size and structure. IAS/Shunts: No atrial level shunt detected by color flow Doppler. Additional Comments: 3D was performed not requiring image post processing on an independent workstation and was normal.  LEFT VENTRICLE PLAX 2D LVIDd:         4.23 cm         Diastology LVIDs:         2.60 cm         LV e' medial:    5.55 cm/s LV PW:         1.05 cm         LV E/e' medial:  8.8 LV IVS:        1.04 cm         LV e' lateral:   7.29 cm/s LVOT diam:     2.00 cm         LV E/e' lateral: 6.7 LV SV:         86 LV SV Index:   46              2D Longitudinal LVOT Area:     3.14 cm        Strain                                2D Strain GLS   -27.3 %                                (A4C):                                2D Strain GLS   -20.8 %                                (A3C):                                2D Strain GLS   -19.4 %                                (A2C):                                2D Strain GLS   -22.5 %  Avg:                                 3D Volume EF                                LV 3D EF:    Left                                             ventricul                                             ar                                             ejection                                             fraction                                             by 3D                                             volume is                                             60 %.                                 3D Volume EF:                                3D EF:        60 %                                LV EDV:       154 ml                                LV ESV:       62 ml                                LV SV:        93 ml RIGHT VENTRICLE RV Basal diam:  3.85 cm RV S prime:     11.20 cm/s TAPSE (M-mode): 2.9 cm LEFT ATRIUM             Index        RIGHT ATRIUM           Index LA diam:        3.10 cm 1.64 cm/m   RA Area:     17.40 cm LA Vol (A2C):   33.7 ml 17.86 ml/m  RA Volume:   42.30 ml  22.42 ml/m LA Vol (A4C):   44.3 ml 23.48 ml/m LA Biplane Vol: 38.4 ml 20.35 ml/m  AORTIC VALVE LVOT Vmax:   133.00 cm/s LVOT Vmean:  84.400 cm/s LVOT VTI:    0.274 m  AORTA Ao Root diam: 3.40 cm Ao Asc diam:  3.60 cm MITRAL VALVE MV Area (PHT): 2.26 cm    SHUNTS MV Decel Time: 335 msec    Systemic VTI:  0.27 m MV E velocity: 48.70 cm/s  Systemic Diam: 2.00 cm MV A velocity: 80.20 cm/s MV E/A ratio:  0.61 Toribio Fuel MD Electronically signed by Toribio Fuel MD Signature Date/Time: 05/09/2024/10:28:40 AM    Final    IR PICC PLACEMENT RIGHT >5 YRS INC IMG GUIDE Result Date: 04/17/2024 INDICATION: Hx of anal cancer. IR consulted for PICC placement for chemotherapy. EXAM: ULTRASOUND AND FLUOROSCOPIC GUIDED PICC LINE INSERTION MEDICATIONS: 3 mL 1% lidocaine  CONTRAST:  None FLUOROSCOPY TIME:  Radiation Exposure Index (as provided by the fluoroscopic device): 1.4 mGy Kerma COMPLICATIONS: None immediate. TECHNIQUE: The procedure, risks, benefits, and alternatives were explained to the patient and informed written consent was obtained. A timeout was performed prior to the initiation of the procedure. The right upper extremity was prepped with chlorhexidine in a sterile fashion, and a sterile drape was applied covering the operative field. Maximum barrier sterile technique with sterile gowns and gloves were used for the procedure. A timeout was performed prior to the initiation of the procedure. Local anesthesia was provided with 1% lidocaine . Under direct ultrasound guidance, the brachial vein was accessed with a micropuncture kit after the overlying soft tissues were anesthetized with 1% lidocaine . Real-time ultrasound guidance was utilized for vascular access including  the acquisition of a permanent ultrasound image documenting patency of the accessed vessel. A guidewire was advanced to the level of the superior caval-atrial junction for measurement purposes and the PICC line was cut to length. A peel-away sheath was placed and a 36 cm, 5 [place], dual lumen was inserted to level of the superior caval-atrial junction. A post procedure spot fluoroscopic was obtained. The catheter easily aspirated and flushed and was secured in place with stat lock device. A dressing was applied. The patient tolerated the procedure well without immediate post procedural complication. FINDINGS: After catheter placement, the tip lies within the superior cavoatrial junction. The catheter aspirates and flushes normally and is ready for immediate use. IMPRESSION: Successful ultrasound and fluoroscopic guided placement of a right brachial vein approach, 36 cm, 5 French, dual lumen PICC with tip at the superior caval-atrial junction. The PICC line is ready for immediate use. Performed by: Wyatt Pommier, PA-C Electronically Signed   By: Juliene Balder M.D.   On: 04/17/2024 11:01

## 2024-05-12 ENCOUNTER — Encounter: Payer: Self-pay | Admitting: Nurse Practitioner

## 2024-05-12 ENCOUNTER — Inpatient Hospital Stay: Attending: Hematology

## 2024-05-12 ENCOUNTER — Inpatient Hospital Stay: Admitting: Nurse Practitioner

## 2024-05-12 VITALS — BP 116/78 | HR 66 | Temp 97.2°F | Resp 17 | Ht 72.0 in | Wt 155.4 lb

## 2024-05-12 DIAGNOSIS — D72819 Decreased white blood cell count, unspecified: Secondary | ICD-10-CM | POA: Diagnosis not present

## 2024-05-12 DIAGNOSIS — D696 Thrombocytopenia, unspecified: Secondary | ICD-10-CM | POA: Insufficient documentation

## 2024-05-12 DIAGNOSIS — C210 Malignant neoplasm of anus, unspecified: Secondary | ICD-10-CM

## 2024-05-12 DIAGNOSIS — C20 Malignant neoplasm of rectum: Secondary | ICD-10-CM | POA: Insufficient documentation

## 2024-05-12 DIAGNOSIS — D649 Anemia, unspecified: Secondary | ICD-10-CM | POA: Insufficient documentation

## 2024-05-12 DIAGNOSIS — R21 Rash and other nonspecific skin eruption: Secondary | ICD-10-CM | POA: Insufficient documentation

## 2024-05-12 LAB — CBC WITH DIFFERENTIAL (CANCER CENTER ONLY)
Abs Immature Granulocytes: 0.02 K/uL (ref 0.00–0.07)
Basophils Absolute: 0 K/uL (ref 0.0–0.1)
Basophils Relative: 1 %
Eosinophils Absolute: 0 K/uL (ref 0.0–0.5)
Eosinophils Relative: 1 %
HCT: 31.2 % — ABNORMAL LOW (ref 39.0–52.0)
Hemoglobin: 10.5 g/dL — ABNORMAL LOW (ref 13.0–17.0)
Immature Granulocytes: 1 %
Lymphocytes Relative: 13 %
Lymphs Abs: 0.4 K/uL — ABNORMAL LOW (ref 0.7–4.0)
MCH: 34 pg (ref 26.0–34.0)
MCHC: 33.7 g/dL (ref 30.0–36.0)
MCV: 101 fL — ABNORMAL HIGH (ref 80.0–100.0)
Monocytes Absolute: 0.5 K/uL (ref 0.1–1.0)
Monocytes Relative: 15 %
Neutro Abs: 2.2 K/uL (ref 1.7–7.7)
Neutrophils Relative %: 69 %
Platelet Count: 142 K/uL — ABNORMAL LOW (ref 150–400)
RBC: 3.09 MIL/uL — ABNORMAL LOW (ref 4.22–5.81)
RDW: 17.3 % — ABNORMAL HIGH (ref 11.5–15.5)
WBC Count: 3.2 K/uL — ABNORMAL LOW (ref 4.0–10.5)
nRBC: 0 % (ref 0.0–0.2)

## 2024-05-12 LAB — CMP (CANCER CENTER ONLY)
ALT: 10 U/L (ref 0–44)
AST: 15 U/L (ref 15–41)
Albumin: 3.2 g/dL — ABNORMAL LOW (ref 3.5–5.0)
Alkaline Phosphatase: 58 U/L (ref 38–126)
Anion gap: 4 — ABNORMAL LOW (ref 5–15)
BUN: 11 mg/dL (ref 8–23)
CO2: 28 mmol/L (ref 22–32)
Calcium: 9.6 mg/dL (ref 8.9–10.3)
Chloride: 109 mmol/L (ref 98–111)
Creatinine: 0.96 mg/dL (ref 0.61–1.24)
GFR, Estimated: >60 mL/min (ref >60)
Glucose, Bld: 95 mg/dL (ref 70–99)
Potassium: 4.2 mmol/L (ref 3.5–5.1)
Sodium: 141 mmol/L (ref 135–145)
Total Bilirubin: 0.4 mg/dL (ref 0.0–1.2)
Total Protein: 6.5 g/dL (ref 6.5–8.1)

## 2024-05-14 ENCOUNTER — Encounter: Payer: Self-pay | Admitting: Hematology

## 2024-05-14 ENCOUNTER — Encounter: Payer: Self-pay | Admitting: Nurse Practitioner

## 2024-05-15 ENCOUNTER — Other Ambulatory Visit: Payer: Self-pay | Admitting: Nurse Practitioner

## 2024-05-15 ENCOUNTER — Encounter: Payer: Self-pay | Admitting: Hematology

## 2024-05-15 MED ORDER — SILVER SULFADIAZINE 1 % EX CREA: 1.0000 | TOPICAL_CREAM | Freq: Every day | CUTANEOUS | 1 refills | Status: AC

## 2024-05-15 MED ORDER — OXYCODONE HCL 5 MG PO TABS: 5.0000 mg | ORAL_TABLET | ORAL | 0 refills | Status: AC | PRN

## 2024-05-23 NOTE — Progress Notes (Signed)
  Radiation Oncology         [phone] ________________________________  Name: Derek Booker MRN: [id number]  Date of Service: 05/29/2024  DOB: November 23, 1945  Post Treatment Telephone Note  Diagnosis:  Squamous Cell Carcinoma of the Anus    First Treatment Date: 2024-03-20 Last Treatment Date: 2024-05-01   Plan Name: Anus Site: Anus Technique: IMRT Mode: Photon Dose Per Fraction: 1.8 Gy Prescribed Dose (Delivered / Prescribed): 54 Gy / 54 Gy Prescribed Fxs (Delivered / Prescribed): 30 / 30  The patient was available for call today.   Symptoms of fatigue have not improved since completing therapy.  Symptoms of skin changes have improved since completing therapy.   Symptoms of bladder changes have improved since completing therapy.  Symptoms of bowel changes have improved since completing therapy.  The patient is not scheduled for surveillance with colorectal surgeon Dr. Bernarda Ned. The patient has scheduled follow up with his medical oncologist Dr. Lanny for ongoing surveillance as well, and follow up with physical therapy. The patient was encouraged to call if he  develops concerns or questions regarding radiation.

## 2024-05-25 ENCOUNTER — Other Ambulatory Visit: Payer: Self-pay | Admitting: Nurse Practitioner

## 2024-05-25 DIAGNOSIS — C210 Malignant neoplasm of anus, unspecified: Secondary | ICD-10-CM

## 2024-05-25 NOTE — Progress Notes (Signed)
 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  No nodal metastasis on MRI. - PET scan was negative for nodal or distant metastasis -He started concurrent chemoradiation with 5-FU and mitomycin  on March 20, 2024 -- Final dose chemotherapy on 04/21/2024. --Final dose radiation on 05/01/2024. - 05/26/2024 -patient labs and physical presentation have improved significantly over the past month.  Though appetite and diet have improved, he has had persistent weight loss.  We discussed increased protein and calorie intake along with aggressive hydration.  Follow-up with him in beginning of December.  Restaging PET scan will be ordered at that time.      Mild anemia Mild and improved anemia with Hgb 11.9 and Hct 35.5. Will continue to monitor closely and treat with IV iron and/or blood if indicated.   Leukopenia Resolved with WBC 4.2 and ANC 2.4. will continue with every visit.   Thrombocytopenia Resolved. Platelet count 220 today, up from 142 on 05/12/2024.  Will continue to monitor closely.   Rash Located around genitalia and anus, stretching up into the gluteal fold and above coccyx. Rash is now much lighter in color, more of a pink hue. There is no longer evidence of peeling skin around the the penis and in the groin.  Previously open areas along both sides of the gluteal fold have healed and are no longer tender. There are no new areas of rash or inflammation.  He was given Silvadene cream per radiology oncology, which he continues to use as needed. Continues to take pain medication when needed. Pain has improved significantly and he  rarely needs to take pain medication.    Plan: Labs reviewed today. -improved anemia. -resolved neutropenia and thrombocytopenia.  -cmp unremarkable.  Rash and pain levels have improved significantly.  Appetite has returned to near normal.  Plan for labs and follow up first week of December, sooner if needed.   The patient understands the plans discussed today and is in agreement with them.  He knows to contact our office if he develops concerns prior to his next appointment.  I provided 25 minutes of face-to-face time during this encounter and > 50% was spent counseling as documented under my assessment and plan.    [name], NP  [facility] - A DEPT OF [facility] [address] [place] [address] Dept: [phone] Dept Fax: [phone]   No orders of the defined types were placed in this encounter.     CHIEF COMPLAINT:  CC: anal cancer  Current Treatment:  completed concurrent chemoradiation on 05/01/2024.  INTERVAL HISTORY:  Derek Booker is here today for repeat clinical assessment. The patient last saw me on 05/12/2024. Rash around genitalia and up gluteal folds has improved significantly.  There is less redness. No new lesions. No onger tender to palpate. Patinet states that he is able to sit without discomfort for long periods of time. States that bowel movements are still very uncomfortable. Continues to use stool softeners and sometimes miralax when needed. Appetite has improved. Eating a much larger array of foods. He denies chest pain, chest pressure, or shortness  of breath. He denies headaches or visual disturbances. He denies abdominal pain, nausea, vomiting, or changes in bowel or bladder habits. He denies fevers or chills. He denies pain. His weight has decreased 9 pounds over last 2 weeks. He assures me he is eating more and more often. He is no longer having diarrhea or nausea. Uneasy about weight  loss. He will be due for restaging PET scan in December.   I have reviewed the past medical history, past surgical history, social history and family history with the patient and they are unchanged from previous note.  ALLERGIES:  is allergic to nitrates, organic; chocolate; and sulfites.  MEDICATIONS:  Current Outpatient Medications  Medication Sig Dispense Refill   ciprofloxacin  (CIPRO ) 500 MG tablet Take 1 tablet (500 mg total) by mouth 2 (two) times daily. 14 tablet 0   cyanocobalamin (VITAMIN B12) 1000 MCG tablet Take 1,000 mcg by mouth daily.     diphenoxylate -atropine  (LOMOTIL ) 2.5-0.025 MG tablet TAKE 1 TABLET BY MOUTH EVERY 6 HOURS AS NEEDED FOR DIARRHEA OR LOOSE STOOLS 30 tablet 1   diphenoxylate -atropine  (LOMOTIL ) 2.5-0.025 MG tablet Take 1 tablet by mouth every 6 (six) hours as needed for diarrhea or loose stools. 30 tablet 0   Docusate Sodium (DSS) 100 MG CAPS 1 capsule as needed Orally Once a day; Duration: 30 day(s)     donepezil (ARICEPT) 10 MG tablet Take 10 mg by mouth daily.     gabapentin (NEURONTIN) 100 MG capsule Take 100 mg by mouth 3 (three) times daily.     levothyroxine  (SYNTHROID , LEVOTHROID) 112 MCG tablet Take 112 mcg by mouth daily before breakfast.     lisinopril (ZESTRIL) 20 MG tablet Take 20 mg by mouth daily.     magic mouthwash (nystatin , lidocaine , diphenhydrAMINE, alum & mag hydroxide) suspension Swish & swallow 5 mLs by mouth 4 (four) times daily as needed for mouth pain. 140 mL 1   methylPREDNISolone  (MEDROL  DOSEPAK) 4 MG TBPK tablet Take with signs of chronic sinusitis and take as directed 1 each 1   mirabegron ER (MYRBETRIQ) 50 MG TB24 tablet Take 50 mg by mouth daily.     omeprazole (PRILOSEC) 20 MG capsule Take 20 mg by mouth daily.     ondansetron  (ZOFRAN ) 8 MG tablet Take 1 tablet (8 mg total) by mouth every 8 (eight) hours as needed for nausea or vomiting. 30 tablet 2   oxyCODONE  (OXY IR/ROXICODONE ) 5 MG immediate release tablet Take 1-2 tablets  (5-10 mg total) by mouth every 4 (four) hours as needed for severe pain (pain score 7-10). 120 tablet 0   pravastatin  (PRAVACHOL ) 40 MG tablet Take 40 mg by mouth daily.     prochlorperazine  (COMPAZINE ) 10 MG tablet Take 1 tablet (10 mg total) by mouth every 6 (six) hours as needed for nausea or vomiting. 30 tablet 1   rivaroxaban  (XARELTO ) 20 MG TABS tablet Take 20 mg by mouth daily.     sertraline  (ZOLOFT ) 50 MG tablet Take 50 mg by mouth daily.     silver sulfADIAZINE (SILVADENE) 1 % cream Apply 1 Application topically daily. 50 g 1   solifenacin (VESICARE) 5 MG tablet Take 5 mg by mouth daily.     sucralfate  (CARAFATE ) 1 g tablet Dissolve tablet in 8 oz of water and drink as directed 28 tablet 0   SUMAtriptan  (IMITREX ) 100 MG tablet Take 100 mg by mouth every 2 (two) hours as needed for migraine. May repeat in 2 hours if headache persists or recurs.  topiramate  (TOPAMAX ) 50 MG tablet Take 50 mg by mouth 2 (two) times daily.     No current facility-administered medications for this visit.    HISTORY OF PRESENT ILLNESS:   Oncology History  Anal cancer (HCC)  03/07/2024 Initial Diagnosis   Anal cancer (HCC)   03/07/2024 Cancer Staging   Staging form: Anus, AJCC V9 - Clinical: Stage IIIA (cT3, cN0, cM0) - Signed by [name], MD on 03/20/2024   03/20/2024 -  Chemotherapy   Patient is on Treatment Plan : ANUS Mitomycin  D1,28 + 5FU D1-4, 28-31 q32d         REVIEW OF SYSTEMS:   Constitutional: Denies fevers or chills.  Despite improved appetite and eating more and more often, patient has lost approximately 9 pounds over the last 2 weeks. Eyes: Denies blurriness of vision Ears, nose, mouth, throat, and face: Denies mucositis or sore throat Respiratory: Denies cough, dyspnea or wheezes Cardiovascular: Denies palpitation, chest discomfort or lower extremity swelling Gastrointestinal:  Denies nausea, heartburn or change in bowel habits Skin: skin rash around the genitalia and up onto  gluteal folds has improved significantly.  No longer painful.  No new places of pain or rash noted. Lymphatics: Denies new lymphadenopathy or easy bruising Neurological:Denies numbness, tingling or new weaknesses Behavioral/Psych: Mood is stable, no new changes  All other systems were reviewed with the patient and are negative.   VITALS:   Today's Vitals   05/26/24 1400 05/26/24 1416  BP:  106/60  Pulse:  70  Resp:  17  Temp:  97.8 F (36.6 C)  TempSrc:  Temporal  SpO2:  97%  Weight:  146 lb 12.8 oz (66.6 kg)  Height:  6' (1.829 m)  PainSc: 0-No pain    Body mass index is 19.91 kg/m.   Wt Readings from Last 3 Encounters:  05/26/24 146 lb 12.8 oz (66.6 kg)  05/12/24 155 lb 6.4 oz (70.5 kg)  05/05/24 150 lb 4.8 oz (68.2 kg)    Body mass index is 19.91 kg/m.  Performance status (ECOG): 1 - Symptomatic but completely ambulatory  PHYSICAL EXAM:   GENERAL:alert, no distress and comfortable SKIN: skin color, texture, turgor are normal, no rashes or significant lesions. Rash is now much lighter in color, more of a pink hue. There is no longer evidence of peeling skin around the the penis and in the groin.  Previously open areas along both sides of the gluteal fold have healed and are no longer tender. There are no new areas of rash or inflammation.  EYES: normal, Conjunctiva are pink and non-injected, sclera clear OROPHARYNX:no exudate, no erythema and lips, buccal mucosa, and tongue normal  NECK: supple, thyroid normal size, non-tender, without nodularity LYMPH:  no palpable lymphadenopathy in the cervical, axillary or inguinal LUNGS: clear to auscultation and percussion with normal breathing effort HEART: regular rate & rhythm and no murmurs and no lower extremity edema ABDOMEN:abdomen soft, non-tender and normal bowel sounds Musculoskeletal:no cyanosis of digits and no clubbing  NEURO: alert & oriented x 3 with fluent speech, no focal motor/sensory deficits  LABORATORY  DATA:  I have reviewed the data as listed    Component Value Date/Time   NA 141 05/26/2024 1342   K 4.5 05/26/2024 1342   CL 112 (H) 05/26/2024 1342   CO2 24 05/26/2024 1342   GLUCOSE 112 (H) 05/26/2024 1342   BUN 13 05/26/2024 1342   CREATININE 0.93 05/26/2024 1342   CALCIUM 9.7 05/26/2024 1342   PROT 6.9 05/26/2024 1342  ALBUMIN 3.6 05/26/2024 1342   AST 20 05/26/2024 1342   ALT 14 05/26/2024 1342   ALKPHOS 64 05/26/2024 1342   BILITOT 0.6 05/26/2024 1342   GFRNONAA >60 05/26/2024 1342   GFRAA >60 12/03/2016 0457    Lab Results  Component Value Date   WBC 4.2 05/26/2024   NEUTROABS 2.4 05/26/2024   HGB 11.9 (L) 05/26/2024   HCT 35.5 (L) 05/26/2024   MCV 102.3 (H) 05/26/2024   PLT 220 05/26/2024

## 2024-05-26 ENCOUNTER — Inpatient Hospital Stay

## 2024-05-26 ENCOUNTER — Inpatient Hospital Stay: Admitting: Nurse Practitioner

## 2024-05-26 VITALS — BP 106/60 | HR 70 | Temp 97.8°F | Resp 17 | Ht 72.0 in | Wt 146.8 lb

## 2024-05-26 DIAGNOSIS — C210 Malignant neoplasm of anus, unspecified: Secondary | ICD-10-CM

## 2024-05-26 DIAGNOSIS — C20 Malignant neoplasm of rectum: Secondary | ICD-10-CM | POA: Diagnosis not present

## 2024-05-26 LAB — CBC WITH DIFFERENTIAL (CANCER CENTER ONLY)
Abs Immature Granulocytes: 0.05 K/uL (ref 0.00–0.07)
Basophils Absolute: 0 K/uL (ref 0.0–0.1)
Basophils Relative: 1 %
Eosinophils Absolute: 0 K/uL (ref 0.0–0.5)
Eosinophils Relative: 1 %
HCT: 35.5 % — ABNORMAL LOW (ref 39.0–52.0)
Hemoglobin: 11.9 g/dL — ABNORMAL LOW (ref 13.0–17.0)
Immature Granulocytes: 1 %
Lymphocytes Relative: 26 %
Lymphs Abs: 1.1 K/uL (ref 0.7–4.0)
MCH: 34.3 pg — ABNORMAL HIGH (ref 26.0–34.0)
MCHC: 33.5 g/dL (ref 30.0–36.0)
MCV: 102.3 fL — ABNORMAL HIGH (ref 80.0–100.0)
Monocytes Absolute: 0.6 K/uL (ref 0.1–1.0)
Monocytes Relative: 15 %
Neutro Abs: 2.4 K/uL (ref 1.7–7.7)
Neutrophils Relative %: 56 %
Platelet Count: 220 K/uL (ref 150–400)
RBC: 3.47 MIL/uL — ABNORMAL LOW (ref 4.22–5.81)
RDW: 18.4 % — ABNORMAL HIGH (ref 11.5–15.5)
WBC Count: 4.2 K/uL (ref 4.0–10.5)
nRBC: 0 % (ref 0.0–0.2)

## 2024-05-26 LAB — CMP (CANCER CENTER ONLY)
ALT: 14 U/L (ref 0–44)
AST: 20 U/L (ref 15–41)
Albumin: 3.6 g/dL (ref 3.5–5.0)
Alkaline Phosphatase: 64 U/L (ref 38–126)
Anion gap: 5 (ref 5–15)
BUN: 13 mg/dL (ref 8–23)
CO2: 24 mmol/L (ref 22–32)
Calcium: 9.7 mg/dL (ref 8.9–10.3)
Chloride: 112 mmol/L — ABNORMAL HIGH (ref 98–111)
Creatinine: 0.93 mg/dL (ref 0.61–1.24)
GFR, Estimated: >60 mL/min (ref >60)
Glucose, Bld: 112 mg/dL — ABNORMAL HIGH (ref 70–99)
Potassium: 4.5 mmol/L (ref 3.5–5.1)
Sodium: 141 mmol/L (ref 135–145)
Total Bilirubin: 0.6 mg/dL (ref 0.0–1.2)
Total Protein: 6.9 g/dL (ref 6.5–8.1)

## 2024-05-26 NOTE — Assessment & Plan Note (Addendum)
 cT3N0M0 - Patient presented with frequent and loose bowel movements -Colonoscopy showed 2 masses in upper and lower rectum, post biopsy showed squamous cell carcinoma.  No nodal metastasis on MRI. - PET scan was negative for nodal or distant metastasis -He started concurrent chemoradiation with 5-FU and mitomycin  on March 20, 2024 -- Final dose chemotherapy on 04/21/2024. --Final dose radiation on 05/01/2024. - 05/26/2024 -patient labs and physical presentation have improved significantly over the past month.  Though appetite and diet have improved, he has had persistent weight loss.  We discussed increased protein and calorie intake along with aggressive hydration.  Follow-up with him in beginning of December.  Restaging PET scan will be ordered at that time.

## 2024-05-28 ENCOUNTER — Other Ambulatory Visit: Payer: Self-pay

## 2024-05-29 ENCOUNTER — Ambulatory Visit
Admission: RE | Admit: 2024-05-29 | Discharge: 2024-05-29 | Disposition: A | Discharge status: Home / Self Care | Source: Ambulatory Visit | Attending: Radiation Oncology | Admitting: Radiation Oncology

## 2024-05-29 DIAGNOSIS — C210 Malignant neoplasm of anus, unspecified: Secondary | ICD-10-CM

## 2024-05-30 ENCOUNTER — Other Ambulatory Visit: Payer: Self-pay

## 2024-06-07 ENCOUNTER — Encounter: Payer: Self-pay | Admitting: Hematology

## 2024-06-12 ENCOUNTER — Encounter: Payer: Self-pay | Admitting: Hematology

## 2024-06-12 ENCOUNTER — Encounter: Payer: Self-pay | Admitting: Nurse Practitioner

## 2024-06-14 ENCOUNTER — Telehealth: Payer: Self-pay | Admitting: Hematology

## 2024-06-14 NOTE — Telephone Encounter (Signed)
 Called the pt  wife and she scheduled and she is aware of her appts and times.

## 2024-06-15 ENCOUNTER — Encounter: Payer: Self-pay | Admitting: Hematology

## 2024-06-15 ENCOUNTER — Other Ambulatory Visit: Payer: Self-pay

## 2024-06-16 ENCOUNTER — Other Ambulatory Visit: Payer: Self-pay | Admitting: Nurse Practitioner

## 2024-06-16 MED ORDER — DIPHENOXYLATE-ATROPINE 2.5-0.025 MG PO TABS: 1.0000 | ORAL_TABLET | Freq: Four times a day (QID) | ORAL | 1 refills | Status: AC | PRN

## 2024-07-01 ENCOUNTER — Encounter: Payer: Self-pay | Admitting: Hematology

## 2024-07-03 ENCOUNTER — Other Ambulatory Visit (HOSPITAL_COMMUNITY): Payer: Self-pay

## 2024-07-03 DIAGNOSIS — C20 Malignant neoplasm of rectum: Secondary | ICD-10-CM

## 2024-07-03 DIAGNOSIS — M51362 Other intervertebral disc degeneration, lumbar region with discogenic back pain and lower extremity pain: Secondary | ICD-10-CM

## 2024-07-03 DIAGNOSIS — M5416 Radiculopathy, lumbar region: Secondary | ICD-10-CM

## 2024-07-03 DIAGNOSIS — M5459 Other low back pain: Secondary | ICD-10-CM

## 2024-07-05 ENCOUNTER — Ambulatory Visit (HOSPITAL_COMMUNITY)
Admission: RE | Admit: 2024-07-05 | Discharge: 2024-07-05 | Disposition: A | Discharge status: Home / Self Care | Source: Ambulatory Visit

## 2024-07-05 DIAGNOSIS — M5416 Radiculopathy, lumbar region: Secondary | ICD-10-CM | POA: Diagnosis present

## 2024-07-05 DIAGNOSIS — C20 Malignant neoplasm of rectum: Secondary | ICD-10-CM | POA: Diagnosis present

## 2024-07-05 DIAGNOSIS — M4316 Spondylolisthesis, lumbar region: Secondary | ICD-10-CM | POA: Diagnosis not present

## 2024-07-05 DIAGNOSIS — M47816 Spondylosis without myelopathy or radiculopathy, lumbar region: Secondary | ICD-10-CM | POA: Diagnosis not present

## 2024-07-05 DIAGNOSIS — M5459 Other low back pain: Secondary | ICD-10-CM | POA: Insufficient documentation

## 2024-07-05 DIAGNOSIS — M48061 Spinal stenosis, lumbar region without neurogenic claudication: Secondary | ICD-10-CM | POA: Diagnosis not present

## 2024-07-05 DIAGNOSIS — M51362 Other intervertebral disc degeneration, lumbar region with discogenic back pain and lower extremity pain: Secondary | ICD-10-CM | POA: Diagnosis present

## 2024-07-09 ENCOUNTER — Other Ambulatory Visit: Payer: Self-pay

## 2024-07-10 ENCOUNTER — Inpatient Hospital Stay: Admitting: Nurse Practitioner

## 2024-07-10 ENCOUNTER — Inpatient Hospital Stay

## 2024-07-11 NOTE — Assessment & Plan Note (Signed)
 cT3N0M0 - Patient presented with frequent and loose bowel movements -Colonoscopy showed 2 masses in upper and lower rectum, post biopsy showed squamous cell carcinoma.  No nodal metastasis on MRI. - PET scan was negative for nodal or distant metastasis -He started concurrent chemoradiation with 5-FU and mitomycin  on March 20, 2024 and completed on Sep 22nd 2025 -rectal exam by Dr. Debby on 06/11/2024 showed near complete response

## 2024-07-12 ENCOUNTER — Inpatient Hospital Stay: Admitting: Hematology

## 2024-07-12 ENCOUNTER — Inpatient Hospital Stay: Attending: Hematology

## 2024-07-12 VITALS — BP 122/64 | HR 54 | Temp 97.8°F | Resp 17 | Wt 155.7 lb

## 2024-07-12 DIAGNOSIS — Z85048 Personal history of other malignant neoplasm of rectum, rectosigmoid junction, and anus: Secondary | ICD-10-CM | POA: Diagnosis present

## 2024-07-12 DIAGNOSIS — C210 Malignant neoplasm of anus, unspecified: Secondary | ICD-10-CM | POA: Diagnosis not present

## 2024-07-12 LAB — CBC WITH DIFFERENTIAL/PLATELET
Abs Immature Granulocytes: 0.08 K/uL — ABNORMAL HIGH (ref 0.00–0.07)
Basophils Absolute: 0 K/uL (ref 0.0–0.1)
Basophils Relative: 0 %
Eosinophils Absolute: 0 K/uL (ref 0.0–0.5)
Eosinophils Relative: 0 %
HCT: 40.4 % (ref 39.0–52.0)
Hemoglobin: 13.4 g/dL (ref 13.0–17.0)
Immature Granulocytes: 1 %
Lymphocytes Relative: 8 %
Lymphs Abs: 0.6 K/uL — ABNORMAL LOW (ref 0.7–4.0)
MCH: 35.9 pg — ABNORMAL HIGH (ref 26.0–34.0)
MCHC: 33.2 g/dL (ref 30.0–36.0)
MCV: 108.3 fL — ABNORMAL HIGH (ref 80.0–100.0)
Monocytes Absolute: 0.4 K/uL (ref 0.1–1.0)
Monocytes Relative: 5 %
Neutro Abs: 7.1 K/uL (ref 1.7–7.7)
Neutrophils Relative %: 86 %
Platelets: 179 K/uL (ref 150–400)
RBC: 3.73 MIL/uL — ABNORMAL LOW (ref 4.22–5.81)
RDW: 14 % (ref 11.5–15.5)
WBC: 8.3 K/uL (ref 4.0–10.5)
nRBC: 0 % (ref 0.0–0.2)

## 2024-07-12 LAB — COMPREHENSIVE METABOLIC PANEL WITH GFR
ALT: 20 U/L (ref 0–44)
AST: 19 U/L (ref 15–41)
Albumin: 4 g/dL (ref 3.5–5.0)
Alkaline Phosphatase: 73 U/L (ref 38–126)
Anion gap: 8 (ref 5–15)
BUN: 22 mg/dL (ref 8–23)
CO2: 27 mmol/L (ref 22–32)
Calcium: 9.4 mg/dL (ref 8.9–10.3)
Chloride: 107 mmol/L (ref 98–111)
Creatinine, Ser: 0.98 mg/dL (ref 0.61–1.24)
GFR, Estimated: >60 mL/min (ref >60)
Glucose, Bld: 103 mg/dL — ABNORMAL HIGH (ref 70–99)
Potassium: 4.6 mmol/L (ref 3.5–5.1)
Sodium: 142 mmol/L (ref 135–145)
Total Bilirubin: 0.4 mg/dL (ref 0.0–1.2)
Total Protein: 7.2 g/dL (ref 6.5–8.1)

## 2024-07-12 NOTE — Progress Notes (Signed)
 [facility]   [phone] [phone]   Clinic Follow up Note   [name], MD as PCP - General (Hematology) [name], [name], RN as Oncology Nurse Navigator  Date of Service:  07/12/2024  CHIEF COMPLAINT: f/u of anal cancer  CURRENT THERAPY:  Cancer surveillance  Oncology History   Anal cancer (HCC) cT3N0M0 - Patient presented with frequent and loose bowel movements -Colonoscopy showed 2 masses in upper and lower rectum, post biopsy showed squamous cell carcinoma.  No nodal metastasis on MRI. - PET scan was negative for nodal or distant metastasis -He started concurrent chemoradiation with 5-FU and mitomycin  on March 20, 2024 and completed on Sep 22nd 2025 -rectal exam by Dr. Debby on 06/11/2024 showed near complete response   Assessment & Plan Anal cancer under active surveillance post-chemoradiation Anal cancer is under active surveillance following chemoradiation. He reports mild discomfort with bowel movements, which is common post-treatment and expected to resolve in the next month or two. Bowel movements are loose to normal, occurring two to four times daily. No accidents reported. Weight has improved but is still below baseline. Blood counts have normalized post-chemotherapy. Radiation effects are expected to continue impacting the tumor for up to three months post-treatment. A PET scan is planned to assess tumor status, with a follow-up with Dr. Debby for endoscopic evaluation. If the PET scan shows no significant residual disease and Dr. Lus examination is unremarkable, continued surveillance will be maintained. The first three years post-treatment are critical for monitoring recurrence, with more frequent visits in the first two years. - Ordered PET scan for mid-January to assess tumor status. - Scheduled follow-up with Dr. Debby in early February for endoscopic evaluation. - Continue active surveillance with  alternating visits every three months with Dr. Debby and the oncologist. - Advised on low-dose Imodium  if bowel movements exceed three per day. - Ensure adequate hydration.   Plan - He is recovering well from chemoradiation - Continue observation for now, follow-up in seven weeks with lab and restaging PET scan 1 week before  SUMMARY OF ONCOLOGIC HISTORY: Oncology History  Anal cancer (HCC)  03/07/2024 Initial Diagnosis   Anal cancer (HCC)   03/07/2024 Cancer Staging   Staging form: Anus, AJCC V9 - Clinical: Stage IIIA (cT3, cN0, cM0) - Signed by [name], MD on 03/20/2024   03/20/2024 -  Chemotherapy   Patient is on Treatment Plan : ANUS Mitomycin  D1,28 + 5FU D1-4, 28-31 q32d        Discussed the use of AI scribe software for clinical note transcription with the patient, who gave verbal consent to proceed.  History of Present Illness Derek Booker is a 78 year old male with anal cancer who presents for follow-up after chemoradiation therapy.  He has ongoing mild pain with bowel movements that began with radiation, previously severe and now improved to mild over the past few weeks. He is concerned about its persistence.  His weight decreased from 180 to 155 pounds during treatment but has begun to increase. His appetite and taste are mostly back to normal.  He has 2 to 3 small bowel movements daily, occasionally 4, with loose to normal consistency. He can now leave home without urgent bathroom needs and recently attended a basketball game without incident. He has no constipation, no frank diarrhea, and no recent incontinence, though he has occasional close calls.  He has new leg pain. A sports medicine physician obtained x-rays and an MRI and  told him there are changes in his lumbar spine at L4-L5.     All other systems were reviewed with the patient and are negative.  MEDICAL HISTORY:  Past Medical History:  Diagnosis Date   DVT (deep venous thrombosis) (HCC)     Hyperlipidemia    Hypertension    Seizures (HCC)    Throat cancer (HCC)     SURGICAL HISTORY: Past Surgical History:  Procedure Laterality Date   CHOLECYSTECTOMY     ORTHOPEDIC SURGERY      I have reviewed the social history and family history with the patient and they are unchanged from previous note.  ALLERGIES:  is allergic to nitrates, organic; chocolate; and sulfites.  MEDICATIONS:  Current Outpatient Medications  Medication Sig Dispense Refill   ciprofloxacin  (CIPRO ) 500 MG tablet Take 1 tablet (500 mg total) by mouth 2 (two) times daily. 14 tablet 0   cyanocobalamin (VITAMIN B12) 1000 MCG tablet Take 1,000 mcg by mouth daily.     diphenoxylate -atropine  (LOMOTIL ) 2.5-0.025 MG tablet Take 1-2 tablets by mouth 4 (four) times daily as needed for diarrhea or loose stools. 30 tablet 1   Docusate Sodium (DSS) 100 MG CAPS 1 capsule as needed Orally Once a day; Duration: 30 day(s)     donepezil (ARICEPT) 10 MG tablet Take 10 mg by mouth daily.     gabapentin (NEURONTIN) 100 MG capsule Take 100 mg by mouth 3 (three) times daily.     levothyroxine  (SYNTHROID , LEVOTHROID) 112 MCG tablet Take 112 mcg by mouth daily before breakfast.     lisinopril (ZESTRIL) 20 MG tablet Take 20 mg by mouth daily.     magic mouthwash (nystatin , lidocaine , diphenhydrAMINE, alum & mag hydroxide) suspension Swish & swallow 5 mLs by mouth 4 (four) times daily as needed for mouth pain. 140 mL 1   methylPREDNISolone  (MEDROL  DOSEPAK) 4 MG TBPK tablet Take with signs of chronic sinusitis and take as directed 1 each 1   mirabegron ER (MYRBETRIQ) 50 MG TB24 tablet Take 50 mg by mouth daily.     omeprazole (PRILOSEC) 20 MG capsule Take 20 mg by mouth daily.     ondansetron  (ZOFRAN ) 8 MG tablet Take 1 tablet (8 mg total) by mouth every 8 (eight) hours as needed for nausea or vomiting. 30 tablet 2   oxyCODONE  (OXY IR/ROXICODONE ) 5 MG immediate release tablet Take 1-2 tablets (5-10 mg total) by mouth every 4 (four) hours  as needed for severe pain (pain score 7-10). 120 tablet 0   pravastatin  (PRAVACHOL ) 40 MG tablet Take 40 mg by mouth daily.     prochlorperazine  (COMPAZINE ) 10 MG tablet Take 1 tablet (10 mg total) by mouth every 6 (six) hours as needed for nausea or vomiting. 30 tablet 1   rivaroxaban  (XARELTO ) 20 MG TABS tablet Take 20 mg by mouth daily.     sertraline  (ZOLOFT ) 50 MG tablet Take 50 mg by mouth daily.     silver  sulfADIAZINE  (SILVADENE ) 1 % cream Apply 1 Application topically daily. 50 g 1   solifenacin (VESICARE) 5 MG tablet Take 5 mg by mouth daily.     sucralfate  (CARAFATE ) 1 g tablet Dissolve tablet in 8 oz of water and drink as directed 28 tablet 0   SUMAtriptan  (IMITREX ) 100 MG tablet Take 100 mg by mouth every 2 (two) hours as needed for migraine. May repeat in 2 hours if headache persists or recurs.     topiramate  (TOPAMAX ) 50 MG tablet Take 50 mg by mouth 2 (  two) times daily.     No current facility-administered medications for this visit.    PHYSICAL EXAMINATION: ECOG PERFORMANCE STATUS: 1 - Symptomatic but completely ambulatory  Vitals:   07/12/24 1054  BP: 122/64  Pulse: (!) 54  Resp: 17  Temp: 97.8 F (36.6 C)  SpO2: 95%   Wt Readings from Last 3 Encounters:  07/12/24 155 lb 11.2 oz (70.6 kg)  05/26/24 146 lb 12.8 oz (66.6 kg)  05/12/24 155 lb 6.4 oz (70.5 kg)     GENERAL:alert, no distress and comfortable SKIN: skin color, texture, turgor are normal, no rashes or significant lesions EYES: normal, Conjunctiva are pink and non-injected, sclera clear NECK: supple, thyroid normal size, non-tender, without nodularity LYMPH:  no palpable lymphadenopathy in the cervical, axillary  LUNGS: clear to auscultation and percussion with normal breathing effort HEART: regular rate & rhythm and no murmurs and no lower extremity edema ABDOMEN:abdomen soft, non-tender and normal bowel sounds Musculoskeletal:no cyanosis of digits and no clubbing  NEURO: alert & oriented x 3 with  fluent speech, no focal motor/sensory deficits  Physical Exam MEASUREMENTS: Weight- 155.  LABORATORY DATA:  I have reviewed the data as listed    Latest Ref Rng & Units 07/12/2024   10:38 AM 05/26/2024    1:42 PM 05/12/2024    8:52 AM  CBC  WBC 4.0 - 10.5 K/uL 8.3  4.2  3.2   Hemoglobin 13.0 - 17.0 g/dL 86.5  88.0  89.4   Hematocrit 39.0 - 52.0 % 40.4  35.5  31.2   Platelets 150 - 400 K/uL 179  220  142         Latest Ref Rng & Units 07/12/2024   10:38 AM 05/26/2024    1:42 PM 05/12/2024    8:52 AM  CMP  Glucose 70 - 99 mg/dL 896  887  95   BUN 8 - 23 mg/dL 22  13  11    Creatinine 0.61 - 1.24 mg/dL 9.01  9.06  9.03   Sodium 135 - 145 mmol/L 142  141  141   Potassium 3.5 - 5.1 mmol/L 4.6  4.5  4.2   Chloride 98 - 111 mmol/L 107  112  109   CO2 22 - 32 mmol/L 27  24  28    Calcium 8.9 - 10.3 mg/dL 9.4  9.7  9.6   Total Protein 6.5 - 8.1 g/dL 7.2  6.9  6.5   Total Bilirubin 0.0 - 1.2 mg/dL 0.4  0.6  0.4   Alkaline Phos 38 - 126 U/L 73  64  58   AST 15 - 41 U/L 19  20  15    ALT 0 - 44 U/L 20  14  10        RADIOGRAPHIC STUDIES: I have personally reviewed the radiological images as listed and agreed with the findings in the report. No results found.    Orders Placed This Encounter  Procedures   NM PET Image Restag (PS) Skull Base To Thigh    Standing Status:   Future    Expected Date:   08/23/2024    Expiration Date:   07/12/2025    If indicated for the ordered procedure, I authorize the administration of a radiopharmaceutical per Radiology protocol:   Yes    Preferred imaging location?:   [facility]   All questions were answered. The patient knows to call the clinic with any problems, questions or concerns. No barriers to learning was detected. The total time spent in  the appointment was 25 minutes, including review of chart and various tests results, discussions about plan of care and coordination of care plan     [name], MD 07/12/2024

## 2024-07-13 ENCOUNTER — Other Ambulatory Visit: Payer: Self-pay

## 2024-08-23 ENCOUNTER — Inpatient Hospital Stay: Attending: Hematology

## 2024-08-23 ENCOUNTER — Ambulatory Visit (HOSPITAL_COMMUNITY)
Admission: RE | Admit: 2024-08-23 | Discharge: 2024-08-23 | Disposition: A | Discharge status: Home / Self Care | Source: Ambulatory Visit | Attending: Hematology | Admitting: Hematology

## 2024-08-23 DIAGNOSIS — Z86718 Personal history of other venous thrombosis and embolism: Secondary | ICD-10-CM | POA: Insufficient documentation

## 2024-08-23 DIAGNOSIS — C210 Malignant neoplasm of anus, unspecified: Secondary | ICD-10-CM | POA: Diagnosis present

## 2024-08-23 DIAGNOSIS — C787 Secondary malignant neoplasm of liver and intrahepatic bile duct: Secondary | ICD-10-CM | POA: Diagnosis present

## 2024-08-23 DIAGNOSIS — Z85818 Personal history of malignant neoplasm of other sites of lip, oral cavity, and pharynx: Secondary | ICD-10-CM | POA: Diagnosis not present

## 2024-08-23 LAB — CBC WITH DIFFERENTIAL/PLATELET
Abs Immature Granulocytes: 0.03 K/uL (ref 0.00–0.07)
Basophils Absolute: 0 K/uL (ref 0.0–0.1)
Basophils Relative: 0 %
Eosinophils Absolute: 0.1 K/uL (ref 0.0–0.5)
Eosinophils Relative: 1 %
HCT: 40.4 % (ref 39.0–52.0)
Hemoglobin: 13.3 g/dL (ref 13.0–17.0)
Immature Granulocytes: 1 %
Lymphocytes Relative: 13 %
Lymphs Abs: 0.7 K/uL (ref 0.7–4.0)
MCH: 34.4 pg — ABNORMAL HIGH (ref 26.0–34.0)
MCHC: 32.9 g/dL (ref 30.0–36.0)
MCV: 104.4 fL — ABNORMAL HIGH (ref 80.0–100.0)
Monocytes Absolute: 0.5 K/uL (ref 0.1–1.0)
Monocytes Relative: 10 %
Neutro Abs: 4.1 K/uL (ref 1.7–7.7)
Neutrophils Relative %: 75 %
Platelets: 213 K/uL (ref 150–400)
RBC: 3.87 MIL/uL — ABNORMAL LOW (ref 4.22–5.81)
RDW: 12.7 % (ref 11.5–15.5)
WBC: 5.4 K/uL (ref 4.0–10.5)
nRBC: 0 % (ref 0.0–0.2)

## 2024-08-23 LAB — COMPREHENSIVE METABOLIC PANEL WITH GFR
ALT: 15 U/L (ref 0–44)
AST: 25 U/L (ref 15–41)
Albumin: 4 g/dL (ref 3.5–5.0)
Alkaline Phosphatase: 114 U/L (ref 38–126)
Anion gap: 11 (ref 5–15)
BUN: 17 mg/dL (ref 8–23)
CO2: 25 mmol/L (ref 22–32)
Calcium: 9.9 mg/dL (ref 8.9–10.3)
Chloride: 107 mmol/L (ref 98–111)
Creatinine, Ser: 1.23 mg/dL (ref 0.61–1.24)
GFR, Estimated: >60 mL/min
Glucose, Bld: 97 mg/dL (ref 70–99)
Potassium: 4.6 mmol/L (ref 3.5–5.1)
Sodium: 143 mmol/L (ref 135–145)
Total Bilirubin: 0.4 mg/dL (ref 0.0–1.2)
Total Protein: 7.6 g/dL (ref 6.5–8.1)

## 2024-08-23 LAB — GLUCOSE, CAPILLARY: Glucose-Capillary: 91 mg/dL (ref 70–99)

## 2024-08-23 MED ORDER — FLUDEOXYGLUCOSE F - 18 (FDG) INJECTION
8.2700 | Freq: Once | INTRAVENOUS | Status: AC | PRN
  Administered 2024-08-23: 8.27 via INTRAVENOUS

## 2024-08-29 NOTE — Progress Notes (Signed)
 DISCONTINUE ON PATHWAY REGIMEN - Anal Carcinoma     One cycle, concurrent with RT:     Fluorouracil       Mitomycin    **Always confirm dose/schedule in your pharmacy ordering system**  PRIOR TREATMENT: ANLOS01: Fluorouracil  1,000 mg/m2/day CIV D1-4, 29-32 + Mitomycin  10 mg/m2 D1, 29 + Concurrent Radiation Therapy  START ON PATHWAY REGIMEN - Anal Carcinoma     Cycles 1 through 6: A cycle is every 28 days:     Retifanlimab-dlwr      Paclitaxel      Carboplatin    Cycles 7 through 13: A cycle is every 28 days:     Retifanlimab-dlwr   **Always confirm dose/schedule in your pharmacy ordering system**  Patient Characteristics: Anal Canal Tumors, Distant Metastases / Local Recurrence - Unresectable, First Line Therapeutic Status: Distant Metastases Line of therapy: First Line Intent of Therapy: Non-Curative / Palliative Intent, Discussed with Patient

## 2024-08-29 NOTE — Assessment & Plan Note (Signed)
 cT3N0M0 - Patient presented with frequent and loose bowel movements -Colonoscopy showed 2 masses in upper and lower rectum, post biopsy showed squamous cell carcinoma.  No nodal metastasis on MRI. - PET scan was negative for nodal or distant metastasis -He started concurrent chemoradiation with 5-FU and mitomycin  on March 20, 2024 and completed on Sep 22nd 2025 -rectal exam by Dr. 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

## 2024-08-30 ENCOUNTER — Inpatient Hospital Stay: Admitting: Hematology

## 2024-08-30 ENCOUNTER — Other Ambulatory Visit: Payer: Self-pay

## 2024-08-30 VITALS — BP 132/76 | HR 66 | Temp 97.7°F | Resp 16 | Ht 72.0 in | Wt 160.7 lb

## 2024-08-30 DIAGNOSIS — C210 Malignant neoplasm of anus, unspecified: Secondary | ICD-10-CM

## 2024-08-30 NOTE — Progress Notes (Signed)
 Verbal order w/readback order from Dr. Lanny for [facility] to be drawn.  Order placed in EPIC and in [facility] portal.  [facility] kt and requisition given to [facility] Lab Receptionist.    Sent Secure Chat message to Central Scheduling biopsy scheduler and IR Scheduler regarding liver biopsy & port-a-cath placement.  Pt's case is currently under review and both will try to work on getting both the liver biopsy & port placement done on the same day.

## 2024-08-30 NOTE — Progress Notes (Signed)
 [facility]   [phone] [phone]   Clinic Follow up Note   Patient Care Team: Derek Callander, MD as PCP - General (Hematology) Derek Booker, Derek CROME, RN as Oncology Nurse Navigator  Date of Service:  08/30/2024  CHIEF COMPLAINT: f/u of [name]  CURRENT THERAPY:  [name]  Oncology History   [name] (HCC) cT3N0M0 - Patient presented with frequent and loose bowel movements -Colonoscopy showed 2 masses in upper and lower rectum, post biopsy showed squamous cell carcinoma.  No nodal metastasis on MRI. - PET scan was negative for nodal or distant metastasis -He started concurrent chemoradiation with 5-FU and mitomycin  on March 20, 2024 and completed on Sep 22nd 2025 -rectal exam by Dr. 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 Recent PET scan revealed multiple new hypermetabolic lesions in both hepatic lobes, highly suspicious for metastatic disease from [name]. No evidence of metastases in the chest or bone. Disease is treatable but not curable due to extensive hepatic involvement. Prognosis discussed: without treatment, life expectancy likely less than one year; with treatment, potential for disease control and life prolongation for several years, but not curative. Risks of chemotherapy and immunotherapy reviewed, including cytopenias, infection, neuropathy, alopecia, and rare but serious autoimmune complications (pneumonitis, hepatitis, myocarditis). - Requested ultrasound-guided needle biopsy of hepatic lesions to confirm diagnosis and determine origin of metastases. - Planned molecular testing on biopsy specimen to assess for actionable mutations and targeted therapy options. - Planned placement of central  venous port for chemotherapy administration, scheduled same day as biopsy per his preference. - Planned initiation of systemic therapy with two chemotherapy agents and one immunotherapy agent, pending biopsy confirmation. - Recommended modified chemotherapy schedule (first week, then every other week) for improved tolerability, rather than standard weekly regimen. - Planned to initiate chemotherapy in approximately 2-3 weeks, after completion of dental work and review of biopsy results. - Planned transition to maintenance immunotherapy after 4-6 months of chemotherapy, contingent on disease response. - Scheduled follow-up visit 2-3 days post-biopsy to review results and finalize treatment plan.  -If biopsy confirms metastatic [name], I recommend chemotherapy carboplatin, paclitaxel and Retifanlimab.  Due to his advanced age, I will reduce carboplatin dose, and paclitaxel to be given on day 1 and 15 only, every 28 days. - Advised completion of dental work prior to chemotherapy to reduce risk of infection and complications. - Discussed timing of elective hip replacement; recommended postponement until after initial chemotherapy and during immunotherapy maintenance phase, unless quality of life necessitates earlier intervention. - Confirmed he is amenable to biopsy and systemic treatment, and prefers same-day port placement and biopsy.  Plan - Personally reviewed his restaging PET scan images with patient and his wife, unfortunately he has new diffuse liver metastasis. - IR ultrasound-guided liver biopsy as soon as possible - Port placement - Will see him after liver biopsy, and start him on chemotherapy carboplatin, paclitaxel and Retifanlimab  -will order NGS [facility]    SUMMARY OF ONCOLOGIC HISTORY: Oncology History  [name] (HCC)  03/07/2024 Initial Diagnosis   [name] (HCC)   03/07/2024 Booker Staging   Staging form: Anus, AJCC V9 - Clinical: Stage IIIA (cT3, cN0, cM0) - Signed  by Derek Callander, MD on 03/20/2024   03/20/2024 - 04/21/2024 Chemotherapy   Patient is  on Treatment Plan : ANUS Mitomycin  D1,28 + 5FU D1-4, 28-31 q32d     09/11/2024 -  Chemotherapy   Patient is on Treatment Plan : ANUS Retifanlimab + Paclitaxel (80) + Carboplatin (5) / Retifanlimab        Discussed the use of AI scribe software for clinical note transcription with the patient, who gave verbal consent to proceed.  History of Present Illness Derek Booker is a 79 year old male with [name] status post chemoradiation, now with new liver metastases, presenting for follow-up and discussion of biopsy and systemic therapy options.  He reports substantial improvement in the primary anal tumor after chemoradiation. He has no change in bowel habits, rectal pain, or significant gastrointestinal symptoms, with only occasional minor hematochezia. Appetite and energy are near baseline and he is independent in activities of daily living.  Recent PET showed near-complete resolution of the primary rectal tumor but new multiple hypermetabolic lesions in both hepatic lobes, not present on July 2025 imaging. There is no metastatic disease in the chest or bone and no enlarged lymph nodes. He is asymptomatic from the liver lesions.  He saw his surgeon about one month ago for rectal exam and endoscopy and has follow-up arranged. He had throat Booker treated nine years ago and no other malignancies.     All other systems were reviewed with the patient and are negative.  MEDICAL HISTORY:  Past Medical History:  Diagnosis Date   DVT (deep venous thrombosis) (HCC)    Hyperlipidemia    Hypertension    Seizures (HCC)    Throat Booker (HCC)     SURGICAL HISTORY: Past Surgical History:  Procedure Laterality Date   CHOLECYSTECTOMY     ORTHOPEDIC SURGERY      I have reviewed the social history and family history with the patient and they are unchanged from previous note.  ALLERGIES:  is allergic to  nitrates, organic; chocolate; and sulfites.  MEDICATIONS:  Current Outpatient Medications  Medication Sig Dispense Refill   ciprofloxacin  (CIPRO ) 500 MG tablet Take 1 tablet (500 mg total) by mouth 2 (two) times daily. 14 tablet 0   cyanocobalamin (VITAMIN B12) 1000 MCG tablet Take 1,000 mcg by mouth daily.     diphenoxylate -atropine  (LOMOTIL ) 2.5-0.025 MG tablet Take 1-2 tablets by mouth 4 (four) times daily as needed for diarrhea or loose stools. 30 tablet 1   Docusate Sodium (DSS) 100 MG CAPS 1 capsule as needed Orally Once a day; Duration: 30 day(s)     donepezil (ARICEPT) 10 MG tablet Take 10 mg by mouth daily.     gabapentin (NEURONTIN) 100 MG capsule Take 100 mg by mouth 3 (three) times daily.     levothyroxine  (SYNTHROID , LEVOTHROID) 112 MCG tablet Take 112 mcg by mouth daily before breakfast.     lisinopril (ZESTRIL) 20 MG tablet Take 20 mg by mouth daily.     magic mouthwash (nystatin , lidocaine , diphenhydrAMINE, alum & mag hydroxide) suspension Swish & swallow 5 mLs by mouth 4 (four) times daily as needed for mouth pain. 140 mL 1   methylPREDNISolone  (MEDROL  DOSEPAK) 4 MG TBPK tablet Take with signs of chronic sinusitis and take as directed 1 each 1   mirabegron ER (MYRBETRIQ) 50 MG TB24 tablet Take 50 mg by mouth daily.     omeprazole (PRILOSEC) 20 MG capsule Take 20 mg by mouth daily.     ondansetron  (ZOFRAN ) 8 MG tablet Take 1 tablet (8 mg total) by mouth every 8 (eight) hours as needed for  nausea or vomiting. 30 tablet 2   oxyCODONE  (OXY IR/ROXICODONE ) 5 MG immediate release tablet Take 1-2 tablets (5-10 mg total) by mouth every 4 (four) hours as needed for severe pain (pain score 7-10). 120 tablet 0   pravastatin  (PRAVACHOL ) 40 MG tablet Take 40 mg by mouth daily.     prochlorperazine  (COMPAZINE ) 10 MG tablet Take 1 tablet (10 mg total) by mouth every 6 (six) hours as needed for nausea or vomiting. 30 tablet 1   rivaroxaban  (XARELTO ) 20 MG TABS tablet Take 20 mg by mouth daily.      sertraline  (ZOLOFT ) 50 MG tablet Take 50 mg by mouth daily.     silver  sulfADIAZINE  (SILVADENE ) 1 % cream Apply 1 Application topically daily. 50 g 1   solifenacin (VESICARE) 5 MG tablet Take 5 mg by mouth daily.     sucralfate  (CARAFATE ) 1 g tablet Dissolve tablet in 8 oz of water and drink as directed 28 tablet 0   SUMAtriptan  (IMITREX ) 100 MG tablet Take 100 mg by mouth every 2 (two) hours as needed for migraine. May repeat in 2 hours if headache persists or recurs.     topiramate  (TOPAMAX ) 50 MG tablet Take 50 mg by mouth 2 (two) times daily.     No current facility-administered medications for this visit.    PHYSICAL EXAMINATION: ECOG PERFORMANCE STATUS: 1 - Symptomatic but completely ambulatory  Vitals:   08/30/24 1000  BP: 132/76  Pulse: 66  Resp: 16  Temp: 97.7 F (36.5 C)  SpO2: 99%   Wt Readings from Last 3 Encounters:  08/30/24 160 lb 11.2 oz (72.9 kg)  07/12/24 155 lb 11.2 oz (70.6 kg)  05/26/24 146 lb 12.8 oz (66.6 kg)     GENERAL:alert, no distress and comfortable SKIN: skin color, texture, turgor are normal, no rashes or significant lesions EYES: normal, Conjunctiva are pink and non-injected, sclera clear NECK: supple, thyroid normal size, non-tender, without nodularity LYMPH:  no palpable lymphadenopathy in the cervical, axillary  LUNGS: clear to auscultation and percussion with normal breathing effort HEART: regular rate & rhythm and no murmurs and no lower extremity edema ABDOMEN:abdomen soft, non-tender and normal bowel sounds Musculoskeletal:no cyanosis of digits and no clubbing  NEURO: alert & oriented x 3 with fluent speech, no focal motor/sensory deficits  Physical Exam    LABORATORY DATA:  I have reviewed the data as listed    Latest Ref Rng & Units 08/23/2024    9:58 AM 07/12/2024   10:38 AM 05/26/2024    1:42 PM  CBC  WBC 4.0 - 10.5 K/uL 5.4  8.3  4.2   Hemoglobin 13.0 - 17.0 g/dL 86.6  86.5  88.0   Hematocrit 39.0 - 52.0 % 40.4  40.4   35.5   Platelets 150 - 400 K/uL 213  179  220         Latest Ref Rng & Units 08/23/2024    9:58 AM 07/12/2024   10:38 AM 05/26/2024    1:42 PM  CMP  Glucose 70 - 99 mg/dL 97  896  887   BUN 8 - 23 mg/dL 17  22  13    Creatinine 0.61 - 1.24 mg/dL 8.76  9.01  9.06   Sodium 135 - 145 mmol/L 143  142  141   Potassium 3.5 - 5.1 mmol/L 4.6  4.6  4.5   Chloride 98 - 111 mmol/L 107  107  112   CO2 22 - 32 mmol/L 25  27  24  Calcium 8.9 - 10.3 mg/dL 9.9  9.4  9.7   Total Protein 6.5 - 8.1 g/dL 7.6  7.2  6.9   Total Bilirubin 0.0 - 1.2 mg/dL 0.4  0.4  0.6   Alkaline Phos 38 - 126 U/L 114  73  64   AST 15 - 41 U/L 25  19  20    ALT 0 - 44 U/L 15  20  14        RADIOGRAPHIC STUDIES: I have personally reviewed the radiological images as listed and agreed with the findings in the report. No results found.    Orders Placed This Encounter  Procedures   US  BIOPSY (LIVER)    Standing Status:   Future    Expected Date:   09/06/2024    Expiration Date:   08/30/2025    Lab orders requested (DO NOT place separate lab orders, these will be automatically ordered during procedure specimen collection)::   Surgical Pathology    Reason for Exam (SYMPTOM  OR DIAGNOSIS REQUIRED):   confirm metastatic disease    Preferred location?:   [facility]   IR IMAGING GUIDED PORT INSERTION    Standing Status:   Future    Expected Date:   09/06/2024    Expiration Date:   08/30/2025    Reason for Exam (SYMPTOM  OR DIAGNOSIS REQUIRED):   chemo    Preferred Imaging Location?:   [facility]   Miscellaneous test (send-out)    Standing Status:   Future    Expiration Date:   08/30/2025    Test name / description::   [facility])   All questions were answered. The patient knows to call the clinic with any problems, questions or concerns. No barriers to learning was detected. The total time spent in the appointment was 60 minutes, including review of chart and various tests results, discussions  about plan of care and coordination of care plan     [name], MD 08/30/2024

## 2024-08-31 ENCOUNTER — Encounter (HOSPITAL_COMMUNITY): Payer: Self-pay

## 2024-08-31 NOTE — Progress Notes (Signed)
 Philip Cornet, MD  Michaelene Setter PROCEDURE / BIOPSY REVIEW Date: 08/31/24  Requested Biopsy site: Liver lesion Reason for request: Needs tissue diagnosis and molecular testing Imaging review: Best seen on PET CT 08/23/24  Decision: Approved Imaging modality to perform: Schedule in IR Schedule with: Moderate Sedation Schedule for: Any VIR  Additional comments: Schedule port placement and liver biopsy at same time.  Please contact me with questions, concerns, or if issue pertaining to this request arise.  Cornet JONELLE Philip, MD [facility]       Previous Messages    ----- Message ----- From: Commodore Bellew Sent: 08/30/2024  12:47 PM EST To: Tiffany Feek; Sevana Grandinetti; Ir Procedure* Subject: US  liver bx                                    Procedure : US  liver bx Want port placement same time if possible   Reason : confirm metastatic disease Dx: Anal cancer (HCC) [C21.0 (ICD-10-CM)]  History : NM PET restage skull base to thigh , MR lumbar spine w/o, MR pelv w/wo  Provider : [name], MD  Contact :[phone]

## 2024-09-01 ENCOUNTER — Telehealth: Payer: Self-pay | Admitting: Hematology

## 2024-09-01 NOTE — Telephone Encounter (Signed)
Left message regarding lab

## 2024-09-02 ENCOUNTER — Encounter: Payer: Self-pay | Admitting: Hematology

## 2024-09-02 MED ORDER — LIDOCAINE-PRILOCAINE 2.5-2.5 % EX CREA: TOPICAL_CREAM | CUTANEOUS | 3 refills | Status: AC

## 2024-09-02 MED ORDER — PROCHLORPERAZINE MALEATE 10 MG PO TABS: 10.0000 mg | ORAL_TABLET | Freq: Four times a day (QID) | ORAL | 1 refills | Status: AC | PRN

## 2024-09-02 MED ORDER — ONDANSETRON HCL 8 MG PO TABS: 8.0000 mg | ORAL_TABLET | Freq: Three times a day (TID) | ORAL | 1 refills | Status: AC | PRN

## 2024-09-03 ENCOUNTER — Encounter: Payer: Self-pay | Admitting: Hematology

## 2024-09-04 ENCOUNTER — Other Ambulatory Visit: Payer: Self-pay

## 2024-09-04 ENCOUNTER — Inpatient Hospital Stay

## 2024-09-05 ENCOUNTER — Encounter: Payer: Self-pay | Admitting: Hematology

## 2024-09-06 ENCOUNTER — Encounter: Payer: Self-pay | Admitting: Hematology

## 2024-09-06 ENCOUNTER — Inpatient Hospital Stay

## 2024-09-06 DIAGNOSIS — C210 Malignant neoplasm of anus, unspecified: Secondary | ICD-10-CM

## 2024-09-06 LAB — MISCELLANEOUS TEST

## 2024-09-08 ENCOUNTER — Other Ambulatory Visit: Payer: Self-pay

## 2024-09-08 DIAGNOSIS — Z01812 Encounter for preprocedural laboratory examination: Secondary | ICD-10-CM

## 2024-09-11 ENCOUNTER — Ambulatory Visit (HOSPITAL_COMMUNITY)
Admission: RE | Admit: 2024-09-11 | Discharge: 2024-09-11 | Disposition: A | Source: Ambulatory Visit | Attending: Hematology

## 2024-09-11 ENCOUNTER — Other Ambulatory Visit: Payer: Self-pay

## 2024-09-11 ENCOUNTER — Encounter (HOSPITAL_COMMUNITY): Payer: Self-pay

## 2024-09-11 DIAGNOSIS — C210 Malignant neoplasm of anus, unspecified: Secondary | ICD-10-CM | POA: Insufficient documentation

## 2024-09-11 DIAGNOSIS — Z79899 Other long term (current) drug therapy: Secondary | ICD-10-CM | POA: Insufficient documentation

## 2024-09-11 DIAGNOSIS — Z923 Personal history of irradiation: Secondary | ICD-10-CM | POA: Insufficient documentation

## 2024-09-11 DIAGNOSIS — Z01812 Encounter for preprocedural laboratory examination: Secondary | ICD-10-CM

## 2024-09-11 DIAGNOSIS — Z9221 Personal history of antineoplastic chemotherapy: Secondary | ICD-10-CM | POA: Insufficient documentation

## 2024-09-11 DIAGNOSIS — C787 Secondary malignant neoplasm of liver and intrahepatic bile duct: Secondary | ICD-10-CM | POA: Insufficient documentation

## 2024-09-11 LAB — CBC
HCT: 40.6 % (ref 39.0–52.0)
Hemoglobin: 13.4 g/dL (ref 13.0–17.0)
MCH: 34.5 pg — ABNORMAL HIGH (ref 26.0–34.0)
MCHC: 33 g/dL (ref 30.0–36.0)
MCV: 104.6 fL — ABNORMAL HIGH (ref 80.0–100.0)
Platelets: 195 10*3/uL (ref 150–400)
RBC: 3.88 MIL/uL — ABNORMAL LOW (ref 4.22–5.81)
RDW: 12.7 % (ref 11.5–15.5)
WBC: 7.2 10*3/uL (ref 4.0–10.5)
nRBC: 0 % (ref 0.0–0.2)

## 2024-09-11 LAB — PROTIME-INR
INR: 1 (ref 0.8–1.2)
Prothrombin Time: 13.9 s (ref 11.4–15.2)

## 2024-09-11 MED ORDER — FENTANYL CITRATE (PF) 100 MCG/2ML IJ SOLN
INTRAMUSCULAR | Status: AC
  Filled 2024-09-11: qty 4

## 2024-09-11 MED ORDER — MIDAZOLAM HCL (PF) 2 MG/2ML IJ SOLN
INTRAMUSCULAR | Status: AC | PRN
  Administered 2024-09-11 (×4): 1 mg via INTRAVENOUS

## 2024-09-11 MED ORDER — HEPARIN SOD (PORK) LOCK FLUSH 100 UNIT/ML IV SOLN
500.0000 [IU] | Freq: Once | INTRAVENOUS | Status: AC
  Administered 2024-09-11: 500 [IU] via INTRAVENOUS

## 2024-09-11 MED ORDER — HEPARIN SOD (PORK) LOCK FLUSH 100 UNIT/ML IV SOLN
INTRAVENOUS | Status: AC
  Filled 2024-09-11: qty 5

## 2024-09-11 MED ORDER — LIDOCAINE-EPINEPHRINE 1 %-1:100000 IJ SOLN
INTRAMUSCULAR | Status: AC
  Filled 2024-09-11: qty 40

## 2024-09-11 MED ORDER — GELATIN ABSORBABLE 12-7 MM EX MISC
1.0000 | Freq: Once | CUTANEOUS | Status: AC
  Administered 2024-09-11: 1 via TOPICAL

## 2024-09-11 MED ORDER — FENTANYL CITRATE (PF) 100 MCG/2ML IJ SOLN
INTRAMUSCULAR | Status: AC | PRN
  Administered 2024-09-11 (×4): 50 ug via INTRAVENOUS

## 2024-09-11 MED ORDER — MIDAZOLAM HCL 2 MG/2ML IJ SOLN
INTRAMUSCULAR | Status: AC
  Filled 2024-09-11: qty 4

## 2024-09-11 MED ORDER — LIDOCAINE-EPINEPHRINE 1 %-1:100000 IJ SOLN
20.0000 mL | Freq: Once | INTRAMUSCULAR | Status: AC
  Administered 2024-09-11: 20 mL via INTRADERMAL

## 2024-09-11 MED ORDER — SODIUM CHLORIDE 0.9 % IV SOLN: INTRAVENOUS | Status: DC

## 2024-09-11 NOTE — Procedures (Signed)
 Interventional Radiology Procedure Note  Procedure: US  LEFT LIVER MET CORE BX    Complications: None  Estimated Blood Loss:  MIN  Findings: 18 G CORE X 4 FULL REPORT IN PACS     EMERSON FREDERIC SPECKING, MD

## 2024-09-11 NOTE — Procedures (Signed)
Interventional Radiology Procedure Note  Procedure: rt internal jugular power port    Complications: None  Estimated Blood Loss:  min  Findings: Tip svcra    Sharen Counter, MD

## 2024-09-11 NOTE — Sedation Documentation (Signed)
 US  liver biopsy complete.  Transferring pt to IR table for port insertion.

## 2024-09-12 ENCOUNTER — Other Ambulatory Visit: Payer: Self-pay

## 2024-09-12 MED FILL — Fosaprepitant Dimeglumine For IV Infusion 150 MG (Base Eq): INTRAVENOUS | Qty: 5 | Status: AC

## 2024-09-12 NOTE — Assessment & Plan Note (Signed)
 cT3N0M0 - Patient presented with frequent and loose bowel movements -Colonoscopy showed 2 masses in upper and lower rectum, post biopsy showed squamous cell carcinoma.  No nodal metastasis on MRI. - PET scan was negative for nodal or distant metastasis -He started concurrent chemoradiation with 5-FU and mitomycin  on March 20, 2024 and completed on Sep 22nd 2025 -rectal exam by Dr. 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

## 2024-09-13 ENCOUNTER — Inpatient Hospital Stay

## 2024-09-13 ENCOUNTER — Encounter: Payer: Self-pay | Admitting: Hematology

## 2024-09-13 ENCOUNTER — Other Ambulatory Visit: Payer: Self-pay

## 2024-09-13 ENCOUNTER — Inpatient Hospital Stay: Attending: Hematology

## 2024-09-13 ENCOUNTER — Inpatient Hospital Stay: Admitting: Hematology

## 2024-09-13 VITALS — BP 113/75 | HR 55 | Temp 98.3°F | Resp 16

## 2024-09-13 VITALS — BP 130/84 | HR 68 | Temp 98.7°F | Resp 16 | Wt 162.8 lb

## 2024-09-13 DIAGNOSIS — C210 Malignant neoplasm of anus, unspecified: Secondary | ICD-10-CM

## 2024-09-13 LAB — CBC WITH DIFFERENTIAL (CANCER CENTER ONLY)
Abs Immature Granulocytes: 0.03 10*3/uL (ref 0.00–0.07)
Basophils Absolute: 0 10*3/uL (ref 0.0–0.1)
Basophils Relative: 0 %
Eosinophils Absolute: 0.1 10*3/uL (ref 0.0–0.5)
Eosinophils Relative: 1 %
HCT: 36.9 % — ABNORMAL LOW (ref 39.0–52.0)
Hemoglobin: 12.5 g/dL — ABNORMAL LOW (ref 13.0–17.0)
Immature Granulocytes: 0 %
Lymphocytes Relative: 5 %
Lymphs Abs: 0.4 10*3/uL — ABNORMAL LOW (ref 0.7–4.0)
MCH: 34.3 pg — ABNORMAL HIGH (ref 26.0–34.0)
MCHC: 33.9 g/dL (ref 30.0–36.0)
MCV: 101.4 fL — ABNORMAL HIGH (ref 80.0–100.0)
Monocytes Absolute: 0.5 10*3/uL (ref 0.1–1.0)
Monocytes Relative: 6 %
Neutro Abs: 7.2 10*3/uL (ref 1.7–7.7)
Neutrophils Relative %: 88 %
Platelet Count: 172 10*3/uL (ref 150–400)
RBC: 3.64 MIL/uL — ABNORMAL LOW (ref 4.22–5.81)
RDW: 12.5 % (ref 11.5–15.5)
WBC Count: 8.2 10*3/uL (ref 4.0–10.5)
nRBC: 0 % (ref 0.0–0.2)

## 2024-09-13 LAB — CMP (CANCER CENTER ONLY)
ALT: 16 U/L (ref 0–44)
AST: 21 U/L (ref 15–41)
Albumin: 3.5 g/dL (ref 3.5–5.0)
Alkaline Phosphatase: 120 U/L (ref 38–126)
Anion gap: 10 (ref 5–15)
BUN: 16 mg/dL (ref 8–23)
CO2: 22 mmol/L (ref 22–32)
Calcium: 9.2 mg/dL (ref 8.9–10.3)
Chloride: 104 mmol/L (ref 98–111)
Creatinine: 1.08 mg/dL (ref 0.61–1.24)
GFR, Estimated: >60 mL/min
Glucose, Bld: 98 mg/dL (ref 70–99)
Potassium: 4.4 mmol/L (ref 3.5–5.1)
Sodium: 136 mmol/L (ref 135–145)
Total Bilirubin: 0.6 mg/dL (ref 0.0–1.2)
Total Protein: 6.9 g/dL (ref 6.5–8.1)

## 2024-09-13 LAB — T4, FREE: Free T4: 1.19 ng/dL (ref 0.80–2.00)

## 2024-09-13 LAB — SURGICAL PATHOLOGY

## 2024-09-13 LAB — TSH: TSH: 1.07 u[IU]/mL (ref 0.350–4.500)

## 2024-09-13 MED ORDER — SODIUM CHLORIDE 0.9 % IV SOLN
150.0000 mg | Freq: Once | INTRAVENOUS | Status: AC
  Administered 2024-09-13: 150 mg via INTRAVENOUS
  Filled 2024-09-13: qty 150

## 2024-09-13 MED ORDER — SODIUM CHLORIDE 0.9 % IV SOLN
328.0000 mg | Freq: Once | INTRAVENOUS | Status: AC
  Administered 2024-09-13: 330 mg via INTRAVENOUS
  Filled 2024-09-13: qty 33

## 2024-09-13 MED ORDER — SODIUM CHLORIDE 0.9% FLUSH: 10.0000 mL | INTRAVENOUS | Status: DC | PRN

## 2024-09-13 MED ORDER — SODIUM CHLORIDE 0.9 % IV SOLN
80.0000 mg/m2 | Freq: Once | INTRAVENOUS | Status: AC
  Administered 2024-09-13: 162 mg via INTRAVENOUS
  Filled 2024-09-13: qty 27

## 2024-09-13 MED ORDER — PALONOSETRON HCL INJECTION 0.25 MG/5ML
0.2500 mg | Freq: Once | INTRAVENOUS | Status: AC
  Administered 2024-09-13: 0.25 mg via INTRAVENOUS
  Filled 2024-09-13: qty 5

## 2024-09-13 MED ORDER — DIPHENHYDRAMINE HCL 50 MG/ML IJ SOLN
50.0000 mg | Freq: Once | INTRAMUSCULAR | Status: AC
  Administered 2024-09-13: 50 mg via INTRAVENOUS
  Filled 2024-09-13: qty 1

## 2024-09-13 MED ORDER — FAMOTIDINE IN NACL 20-0.9 MG/50ML-% IV SOLN
20.0000 mg | Freq: Once | INTRAVENOUS | Status: AC
  Administered 2024-09-13: 20 mg via INTRAVENOUS
  Filled 2024-09-13: qty 50

## 2024-09-13 MED ORDER — DEXAMETHASONE SOD PHOSPHATE PF 10 MG/ML IJ SOLN
10.0000 mg | Freq: Once | INTRAMUSCULAR | Status: AC
  Administered 2024-09-13: 10 mg via INTRAVENOUS
  Filled 2024-09-13: qty 1

## 2024-09-13 MED ORDER — SODIUM CHLORIDE 0.9 % IV SOLN: INTRAVENOUS | Status: DC

## 2024-09-13 MED ORDER — SODIUM CHLORIDE 0.9 % IV SOLN
500.0000 mg | Freq: Once | INTRAVENOUS | Status: AC
  Administered 2024-09-13: 500 mg via INTRAVENOUS
  Filled 2024-09-13: qty 20

## 2024-09-13 NOTE — Patient Instructions (Signed)
 [facility] - A DEPT OF [facility]  Discharge Instructions: Thank you for choosing [facility] to provide your oncology and hematology care.   If you have a lab appointment with the Cancer Center, please go directly to the Cancer Center and check in at the registration area.   Wear comfortable clothing and clothing appropriate for easy access to any Portacath or PICC line.   We strive to give you quality time with your provider. You may need to reschedule your appointment if you arrive late (15 or more minutes).  Arriving late affects you and other patients whose appointments are after yours.  Also, if you miss three or more appointments without notifying the office, you may be dismissed from the clinic at the providers discretion.      For prescription refill requests, have your pharmacy contact our office and allow 72 hours for refills to be completed.    Today you received the following chemotherapy and/or immunotherapy agents Retifanlimab , Paclitaxel , carboplatin       To help prevent nausea and vomiting after your treatment, we encourage you to take your nausea medication as directed.  BELOW ARE SYMPTOMS THAT SHOULD BE REPORTED IMMEDIATELY: *FEVER GREATER THAN 100.4 F (38 C) OR HIGHER *CHILLS OR SWEATING *NAUSEA AND VOMITING THAT IS NOT CONTROLLED WITH YOUR NAUSEA MEDICATION *UNUSUAL SHORTNESS OF BREATH *UNUSUAL BRUISING OR BLEEDING *URINARY PROBLEMS (pain or burning when urinating, or frequent urination) *BOWEL PROBLEMS (unusual diarrhea, constipation, pain near the anus) TENDERNESS IN MOUTH AND THROAT WITH OR WITHOUT PRESENCE OF ULCERS (sore throat, sores in mouth, or a toothache) UNUSUAL RASH, SWELLING OR PAIN  UNUSUAL VAGINAL DISCHARGE OR ITCHING   Items with * indicate a potential emergency and should be followed up as soon as possible or go to the Emergency Department if any problems should occur.  Please show the CHEMOTHERAPY  ALERT CARD or IMMUNOTHERAPY ALERT CARD at check-in to the Emergency Department and triage nurse.  Should you have questions after your visit or need to cancel or reschedule your appointment, please contact [facility] - [facility] DE[facility]  Dept: [phone]  and follow the prompts.  Office hours are 8:00 a.m. to 4:30 p.m. Monday - Friday. Please note that voicemails left after 4:00 p.m. may not be returned until the following business day.  We are closed weekends and major holidays. You have access to a nurse at all times for urgent questions. Please call the main number to the clinic Dept: [phone] and follow the prompts.   For any non-urgent questions, you may also contact your provider using MyChart. We now offer e-Visits for anyone 69 and older to request care online for non-urgent symptoms. For details visit [email or web address].   Also download the MyChart app! Go to the app store, search MyChart, open the app, select [facility], and log in with your MyChart username and password.  Retifanlimab  Injection What is this medication? RETIFANLIMAB  (RE ti FAN li mab) treats skin cancer and anal cancer. It works by helping your immune system slow or stop the spread of cancer cells. It is a monoclonal antibody. This medicine may be used for other purposes; ask your health care provider or pharmacist if you have questions. COMMON BRAND NAME(S): ZYNYZ  What should I tell my care team before I take this medication? They need to know if you have any of these conditions: Allogeneic stem cell transplant (uses someone  else's stem cells) Autoimmune diseases, such as Crohn's disease, ulcerative colitis, lupus History of chest radiation Nervous system problems, such as Guillain-Barre syndrome or myasthenia gravis Organ transplant An unusual or allergic reaction to retifanlimab , other medications, foods, dyes, or preservatives Pregnant or trying to get  pregnant Breast-feeding How should I use this medication? This medication is infused into a vein. It is given by your care team in a hospital or clinic setting. A special MedGuide will be given to you before each treatment. Be sure to read this information carefully each time. Talk to your care team about the use of this medication in children. Special care may be needed. Overdosage: If you think you have taken too much of this medicine contact a poison control center or emergency room at once. NOTE: This medicine is only for you. Do not share this medicine with others. What if I miss a dose? Keep appointments for follow-up doses. It is important not to miss your dose. Call your care team if you are unable to keep an appointment. What may interact with this medication? Interactions have not been studied. This list may not describe all possible interactions. Give your health care provider a list of all the medicines, herbs, non-prescription drugs, or dietary supplements you use. Also tell them if you smoke, drink alcohol, or use illegal drugs. Some items may interact with your medicine. What should I watch for while using this medication? Visit your care team for regular checks on your progress. It may be some time before you see the benefit from this medication. You may need blood work while taking this medication. This medication may cause serious skin reactions. They can happen weeks to months after starting the medication. Contact your care team right away if you notice fevers or flu-like symptoms with a rash. The rash may be red or purple and then turn into blisters or peeling of the skin. You may also notice a red rash with swelling of the face, lips, or lymph nodes in your neck or under your arms. Tell your care team right away if you have any change in your eyesight. Talk to your care team if you wish to become pregnant or think you might be pregnant. A negative pregnancy test is required  before starting this medication. A reliable form of contraception is recommended while taking this medication and for 4 months after stopping therapy. Talk to your care team about effective forms of contraception. Do not breast-feed while taking this medication and for 4 months after stopping therapy. What side effects may I notice from receiving this medication? Side effects that you should report to your care team as soon as possible: Allergic reactions--skin rash, itching, hives, swelling of the face, lips, tongue, or throat Dry cough, shortness of breath or trouble breathing Eye pain, redness, irritation, or discharge with blurry or decreased vision Heart muscle inflammation--unusual weakness or fatigue, shortness of breath, chest pain, fast or irregular heartbeat, dizziness, swelling of the ankles, feet, or hands Hormone gland problems--headache, sensitivity to light, unusual weakness or fatigue, dizziness, fast or irregular heartbeat, increased sensitivity to cold or heat, excessive sweating, constipation, hair loss, increased thirst or amount of urine, tremors or shaking, irritability Infusion reactions--chest pain, shortness of breath or trouble breathing, feeling faint or lightheaded Kidney injury (glomerulonephritis)--decrease in the amount of urine, red or dark brown urine, foamy or bubbly urine, swelling of the ankles, hands, or feet Liver injury--right upper belly pain, loss of appetite, nausea, light-colored stool, dark yellow  or brown urine, yellowing skin or eyes, unusual weakness or fatigue Pain, tingling, or numbness in the hands or feet, muscle weakness, change in vision, confusion or trouble speaking, loss of balance or coordination, trouble walking, seizures Rash, fever, and swollen lymph nodes Redness, blistering, peeling, or loosening of the skin, including inside the mouth Sudden or severe stomach pain, bloody diarrhea, fever, nausea, vomiting Side effects that usually do not  require medical attention (report these to your care team if they continue or are bothersome): Bone, joint, or muscle pain Diarrhea Fatigue Loss of appetite Nausea Skin rash This list may not describe all possible side effects. Call your doctor for medical advice about side effects. You may report side effects to FDA at 1-800-FDA-1088. Where should I keep my medication? This medication is given in a hospital or clinic. It will not be stored at home. NOTE: This sheet is a summary. It may not cover all possible information. If you have questions about this medicine, talk to your doctor, pharmacist, or health care provider.  2025 Elsevier/Gold Standard (2024-01-13 00:00:00)  Paclitaxel  Injection What is this medication? PACLITAXEL  (PAK li TAX el) treats some types of cancer. It works by slowing down the growth of cancer cells. This medicine may be used for other purposes; ask your health care provider or pharmacist if you have questions. COMMON BRAND NAME(S): Onxol, Taxol  What should I tell my care team before I take this medication? They need to know if you have any of these conditions: Heart disease Liver disease Low white blood cell levels An unusual or allergic reaction to paclitaxel , other medications, foods, dyes, or preservatives If you or your partner are pregnant or trying to get pregnant Breast-feeding How should I use this medication? This medication is infused into a vein. It is given by your care team in a hospital or clinic setting. Talk to your care team about the use of this medication in children. Special care may be needed. Overdosage: If you think you have taken too much of this medicine contact a poison control center or emergency room at once. NOTE: This medicine is only for you. Do not share this medicine with others. What if I miss a dose? Keep appointments for follow-up doses. It is important not to miss your dose. Call your care team if you are unable to keep an  appointment. What may interact with this medication? Do not take this medication with any of the following: Live virus vaccines Other medications may affect the way this medication works. Talk with your care team about all of the medications you take. They may suggest changes to your treatment plan to lower the risk of side effects and to make sure your medications work as intended. This list may not describe all possible interactions. Give your health care provider a list of all the medicines, herbs, non-prescription drugs, or dietary supplements you use. Also tell them if you smoke, drink alcohol, or use illegal drugs. Some items may interact with your medicine. What should I watch for while using this medication? Your condition will be monitored carefully while you are receiving this medication. You may need blood work while taking this medication. This medication may make you feel generally unwell. This is not uncommon as chemotherapy can affect healthy cells as well as cancer cells. Report any side effects. Continue your course of treatment even though you feel ill unless your care team tells you to stop. This medication can cause serious allergic reactions. To reduce the  risk, your care team may give you other medications to take before receiving this one. Be sure to follow the directions from your care team. This medication may increase your risk of getting an infection. Call your care team for advice if you get a fever, chills, sore throat, or other symptoms of a cold or flu. Do not treat yourself. Try to avoid being around people who are sick. This medication may increase your risk to bruise or bleed. Call your care team if you notice any unusual bleeding. Be careful brushing or flossing your teeth or using a toothpick because you may get an infection or bleed more easily. If you have any dental work done, tell your dentist you are receiving this medication. Talk to your care team if you may be  pregnant. Serious birth defects can occur if you take this medication during pregnancy. Talk to your care team before breastfeeding. Changes to your treatment plan may be needed. What side effects may I notice from receiving this medication? Side effects that you should report to your care team as soon as possible: Allergic reactions--skin rash, itching, hives, swelling of the face, lips, tongue, or throat Heart rhythm changes--fast or irregular heartbeat, dizziness, feeling faint or lightheaded, chest pain, trouble breathing Increase in blood pressure Infection--fever, chills, cough, sore throat, wounds that don't heal, pain or trouble when passing urine, general feeling of discomfort or being unwell Low blood pressure--dizziness, feeling faint or lightheaded, blurry vision Low red blood cell level--unusual weakness or fatigue, dizziness, headache, trouble breathing Painful swelling, warmth, or redness of the skin, blisters or sores at the infusion site Pain, tingling, or numbness in the hands or feet Slow heartbeat--dizziness, feeling faint or lightheaded, confusion, trouble breathing, unusual weakness or fatigue Unusual bruising or bleeding Side effects that usually do not require medical attention (report to your care team if they continue or are bothersome): Diarrhea Hair loss Joint pain Loss of appetite Muscle pain Nausea Vomiting This list may not describe all possible side effects. Call your doctor for medical advice about side effects. You may report side effects to FDA at 1-800-FDA-1088. Where should I keep my medication? This medication is given in a hospital or clinic. It will not be stored at home. NOTE: This sheet is a summary. It may not cover all possible information. If you have questions about this medicine, talk to your doctor, pharmacist, or health care provider.  2025 Elsevier/Gold Standard (2024-06-01 00:00:00)  Carboplatin  Injection What is this  medication? CARBOPLATIN  (KAR boe pla tin) treats some types of cancer. It works by slowing down the growth of cancer cells. This medicine may be used for other purposes; ask your health care provider or pharmacist if you have questions. COMMON BRAND NAME(S): Paraplatin  What should I tell my care team before I take this medication? They need to know if you have any of these conditions: Blood disorders Hearing problems Kidney disease Recent or ongoing radiation therapy An unusual or allergic reaction to carboplatin , cisplatin, other medications, foods, dyes, or preservatives Pregnant or trying to get pregnant Breast-feeding How should I use this medication? This medication is injected into a vein. It is given by your care team in a hospital or clinic setting. Talk to your care team about the use of this medication in children. Special care may be needed. Overdosage: If you think you have taken too much of this medicine contact a poison control center or emergency room at once. NOTE: This medicine is only for you. Do  not share this medicine with others. What if I miss a dose? Keep appointments for follow-up doses. It is important not to miss your dose. Call your care team if you are unable to keep an appointment. What may interact with this medication? Medications for seizures Some antibiotics, such as amikacin, gentamicin, neomycin, streptomycin, tobramycin Vaccines This list may not describe all possible interactions. Give your health care provider a list of all the medicines, herbs, non-prescription drugs, or dietary supplements you use. Also tell them if you smoke, drink alcohol, or use illegal drugs. Some items may interact with your medicine. What should I watch for while using this medication? Your condition will be monitored carefully while you are receiving this medication. You may need blood work while taking this medication. This medication may make you feel generally unwell. This  is not uncommon, as chemotherapy can affect healthy cells as well as cancer cells. Report any side effects. Continue your course of treatment even though you feel ill unless your care team tells you to stop. In some cases, you may be given additional medications to help with side effects. Follow all directions for their use. This medication may increase your risk of getting an infection. Call your care team for advice if you get a fever, chills, sore throat, or other symptoms of a cold or flu. Do not treat yourself. Try to avoid being around people who are sick. Avoid taking medications that contain aspirin , acetaminophen , ibuprofen, naproxen, or ketoprofen unless instructed by your care team. These medications may hide a fever. Be careful brushing or flossing your teeth or using a toothpick because you may get an infection or bleed more easily. If you have any dental work done, tell your dentist you are receiving this medication. Talk to your care team if you wish to become pregnant or think you might be pregnant. This medication can cause serious birth defects. Talk to your care team about effective forms of contraception. Do not breast-feed while taking this medication. What side effects may I notice from receiving this medication? Side effects that you should report to your care team as soon as possible: Allergic reactions--skin rash, itching, hives, swelling of the face, lips, tongue, or throat Infection--fever, chills, cough, sore throat, wounds that don't heal, pain or trouble when passing urine, general feeling of discomfort or being unwell Low red blood cell level--unusual weakness or fatigue, dizziness, headache, trouble breathing Pain, tingling, or numbness in the hands or feet, muscle weakness, change in vision, confusion or trouble speaking, loss of balance or coordination, trouble walking, seizures Unusual bruising or bleeding Side effects that usually do not require medical attention  (report to your care team if they continue or are bothersome): Hair loss Nausea Unusual weakness or fatigue Vomiting This list may not describe all possible side effects. Call your doctor for medical advice about side effects. You may report side effects to FDA at 1-800-FDA-1088. Where should I keep my medication? This medication is given in a hospital or clinic. It will not be stored at home. NOTE: This sheet is a summary. It may not cover all possible information. If you have questions about this medicine, talk to your doctor, pharmacist, or health care provider.  2024 Elsevier/Gold Standard (2021-11-18 00:00:00)

## 2024-09-13 NOTE — Progress Notes (Signed)
 " [facility]   [phone] Fax:([phone]   Clinic Follow up Note   [name], MD as PCP - General (Hematology) [name] [name], RN as Oncology Nurse Navigator  Date of Service:  09/13/2024  CHIEF COMPLAINT: f/u of metastatic anal cancer  CURRENT THERAPY:  First-line chemotherapy carboplatin , paclitaxel  and Retifanlimab    Oncology History   Anal cancer (HCC) [id number] - Patient presented with frequent and loose bowel movements -Colonoscopy showed 2 masses in upper and lower rectum, post biopsy showed squamous cell carcinoma.  No nodal metastasis on MRI. - PET scan was negative for nodal or distant metastasis -He started concurrent chemoradiation with 5-FU and mitomycin  on March 20, 2024 and completed on Sep 22nd 2025 -rectal exam by Dr. 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 Initiateing first cycle of systemic therapy with carboplatin , paclitaxel , and immunotherapy for palliative intent, aiming for disease control and life prolongation. Prognosis remains poor due to extensive hepatic disease, but therapy may extend survival. Risks include fatigue, anorexia, nausea, dysgeusia, cytopenias, infection, alopecia, neuropathy, and immune-related adverse events (thyroid dysfunction, pneumonitis, rash, arthralgia, rare organ inflammation). Neuropathy and immune-related toxicities may require dose modification or treatment interruption. - Initiated carboplatin  and paclitaxel  as first cycle of chemotherapy and immunotherapy. - Provided anticipatory guidance regarding side effects and recommended use of ice on hands and feet during infusions to reduce neuropathy risk. - Advised dietary modifications: avoid raw foods, consume well-cooked  meats, prioritize high-protein and high-calorie intake, and consider nutritional supplements for decreased appetite. - Advised mask use in public, especially indoors, to reduce infection risk. - Encouraged light physical activity as tolerated. - Planned repeat imaging after 3-4 cycles to assess response; if favorable response after 4-6 months, transition to maintenance immunotherapy. - Scheduled follow-up every two weeks for toxicity monitoring and management; periodic thyroid and hepatic function monitoring due to immunotherapy. - Instructed to monitor for and report symptoms of immune-related adverse events (cough, dyspnea, rash, arthralgia, weakness, paresthesias, chest pain). - Advised to continue all current medications unless otherwise directed; specifically, to monitor blood pressure and hold antihypertensives if hypotensive post-chemotherapy. - Instructed to report new symptoms or concerns during treatment.  Hypothyroidism Hypothyroidism managed with levothyroxine . Immunotherapy may induce thyroid dysfunction, generally manageable with medication adjustment. - Planned periodic thyroid function testing during immunotherapy. - Continue levothyroxine .  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 - Continue rivaroxaban .  Hypertension Hypertension managed with lisinopril. Chemotherapy may cause hypotension due to decreased oral intake, necessitating close monitoring and possible antihypertensive adjustment. - Continue lisinopril. - Instructed to monitor blood pressure at home and hold lisinopril if hypotensive, especially post-chemotherapy.  Plan - Reviewed liver biopsy results, which confirmed metastatic squamous cell carcinoma. - Will proceed for cycle chemotherapy - He will return in 2 weeks for paclitaxel  -f/u in 2 weeks    SUMMARY OF ONCOLOGIC HISTORY: Oncology History  Anal cancer (HCC)  03/07/2024 Initial Diagnosis   Anal cancer  (HCC)   03/07/2024 Cancer Staging   Staging form: Anus, AJCC V9 - Clinical: Stage IIIA (cT3, cN0, cM0) - Signed by [name], MD on 03/20/2024   03/20/2024 - 04/21/2024 Chemotherapy   Patient is on Treatment Plan : ANUS Mitomycin  D1,28 + 5FU D1-4, 28-31 q32d     09/13/2024 -  Chemotherapy   Patient is on  Treatment Plan : ANUS Retifanlimab  + Paclitaxel  (80) + Carboplatin  (5) / Retifanlimab         Discussed the use of AI scribe software for clinical note transcription with the patient, who gave verbal consent to proceed.  History of Present Illness Derek Booker is a 79 year old male with metastatic anal cancer involving the liver who presents for initiation of first cycle chemotherapy.  He is scheduled to begin systemic therapy today with carboplatin , paclitaxel , and immunotherapy. He had port placement and liver biopsy two days ago with mild, improving incision pain and no ongoing complications or persistent cardiopulmonary symptoms.  He has prior chemotherapy experience and understands expected toxicities including fatigue, anorexia, nausea, dysgeusia, cytopenias, and infection risk. He has antiemetics at home and currently has no cough, dyspnea, or chest pain.  He is up to date on COVID and influenza vaccines and has completed pneumococcal vaccination as advised. He continues lisinopril, gabapentin, levothyroxine , rivaroxaban , sertraline , sumatriptan , and topiramate . He remains physically active and uses a treadmill at home.  Aug 30, 2024: Follow-up visit for anal cancer with new hepatic metastases. Reviewed recent PET scan showing multiple new hypermetabolic liver lesions; patient remains asymptomatic from liver disease. Planned ultrasound-guided liver biopsy and central venous port placement, discussed initiation of systemic therapy with carboplatin , paclitaxel , and Retifanlimab  pending biopsy confirmation, and recommended completion of dental work prior to chemotherapy.     All other  systems were reviewed with the patient and are negative.  MEDICAL HISTORY:  Past Medical History:  Diagnosis Date   DVT (deep venous thrombosis) (HCC)    Hyperlipidemia    Hypertension    Seizures (HCC)    Throat cancer (HCC)     SURGICAL HISTORY: Past Surgical History:  Procedure Laterality Date   CHOLECYSTECTOMY     IR IMAGING GUIDED PORT INSERTION  09/11/2024   IR US  LIVER BIOPSY  09/11/2024   ORTHOPEDIC SURGERY      I have reviewed the social history and family history with the patient and they are unchanged from previous note.  ALLERGIES:  is allergic to nitrates, organic; chocolate; and sulfites.  MEDICATIONS:  Current Outpatient Medications  Medication Sig Dispense Refill   ciprofloxacin  (CIPRO ) 500 MG tablet Take 1 tablet (500 mg total) by mouth 2 (two) times daily. 14 tablet 0   cyanocobalamin (VITAMIN B12) 1000 MCG tablet Take 1,000 mcg by mouth daily.     diphenoxylate -atropine  (LOMOTIL ) 2.5-0.025 MG tablet Take 1-2 tablets by mouth 4 (four) times daily as needed for diarrhea or loose stools. 30 tablet 1   Docusate Sodium (DSS) 100 MG CAPS 1 capsule as needed Orally Once a day; Duration: 30 day(s)     donepezil (ARICEPT) 10 MG tablet Take 10 mg by mouth daily.     gabapentin (NEURONTIN) 100 MG capsule Take 100 mg by mouth 3 (three) times daily.     levothyroxine  (SYNTHROID , LEVOTHROID) 112 MCG tablet Take 112 mcg by mouth daily before breakfast.     lidocaine -prilocaine  (EMLA ) cream Apply to affected area once 30 g 3   lisinopril (ZESTRIL) 20 MG tablet Take 20 mg by mouth daily.     magic mouthwash (nystatin , lidocaine , diphenhydrAMINE , alum & mag hydroxide) suspension Swish & swallow 5 mLs by mouth 4 (four) times daily as needed for mouth pain. 140 mL 1   methylPREDNISolone  (MEDROL  DOSEPAK) 4 MG TBPK tablet Take with signs of chronic sinusitis and take as directed 1 each 1   mirabegron ER (MYRBETRIQ) 50 MG TB24 tablet Take 50  mg by mouth daily.     omeprazole  (PRILOSEC) 20 MG capsule Take 20 mg by mouth daily.     ondansetron  (ZOFRAN ) 8 MG tablet Take 1 tablet (8 mg total) by mouth every 8 (eight) hours as needed for nausea or vomiting. 30 tablet 2   ondansetron  (ZOFRAN ) 8 MG tablet Take 1 tablet (8 mg total) by mouth every 8 (eight) hours as needed for nausea or vomiting. Start on the third day after carboplatin . 30 tablet 1   oxyCODONE  (OXY IR/ROXICODONE ) 5 MG immediate release tablet Take 1-2 tablets (5-10 mg total) by mouth every 4 (four) hours as needed for severe pain (pain score 7-10). 120 tablet 0   pravastatin  (PRAVACHOL ) 40 MG tablet Take 40 mg by mouth daily.     prochlorperazine  (COMPAZINE ) 10 MG tablet Take 1 tablet (10 mg total) by mouth every 6 (six) hours as needed for nausea or vomiting. 30 tablet 1   prochlorperazine  (COMPAZINE ) 10 MG tablet Take 1 tablet (10 mg total) by mouth every 6 (six) hours as needed for nausea or vomiting. 30 tablet 1   rivaroxaban  (XARELTO ) 20 MG TABS tablet Take 20 mg by mouth daily.     sertraline  (ZOLOFT ) 50 MG tablet Take 50 mg by mouth daily.     silver  sulfADIAZINE  (SILVADENE ) 1 % cream Apply 1 Application topically daily. 50 g 1   solifenacin (VESICARE) 5 MG tablet Take 5 mg by mouth daily.     sucralfate  (CARAFATE ) 1 g tablet Dissolve tablet in 8 oz of water and drink as directed 28 tablet 0   SUMAtriptan  (IMITREX ) 100 MG tablet Take 100 mg by mouth every 2 (two) hours as needed for migraine. May repeat in 2 hours if headache persists or recurs.     topiramate  (TOPAMAX ) 50 MG tablet Take 50 mg by mouth 2 (two) times daily.     No current facility-administered medications for this visit.   Facility-Administered Medications Ordered in Other Visits  Medication Dose Route Frequency Provider Last Rate Last Admin   0.9 %  sodium chloride  infusion   Intravenous Continuous [name], MD 10 mL/hr at 09/13/24 0909 New Bag at 09/13/24 0909   CARBOplatin  (PARAPLATIN ) 330 mg in sodium chloride  0.9 % 100 mL chemo  infusion  330 mg Intravenous Once [name], MD       famotidine  (PEPCID ) IVPB 20 mg premix  20 mg Intravenous Once [name], MD 200 mL/hr at 09/13/24 0939 20 mg at 09/13/24 0939   fosaprepitant  (EMEND) 150 mg in sodium chloride  0.9 % 145 mL IVPB  150 mg Intravenous Once [name], MD       PACLitaxel  (TAXOL ) 162 mg in sodium chloride  0.9 % 250 mL chemo infusion (</= 80mg /m2)  80 mg/m2 (Order-Specific) Intravenous Once [name], MD       retifanlimab -dlwr (ZYNYZ ) 500 mg in sodium chloride  0.9 % 100 mL chemo infusion  500 mg Intravenous Once [name], MD       sodium chloride  flush (NS) 0.9 % injection 10 mL  10 mL Intracatheter PRN [name], MD        PHYSICAL EXAMINATION: ECOG PERFORMANCE STATUS: 1 - Symptomatic but completely ambulatory  Vitals:   09/13/24 0814  BP: 130/84  Pulse: 68  Resp: 16  Temp: 98.7 F (37.1 C)  SpO2: 96%   Wt Readings from Last 3 Encounters:  09/13/24 162 lb 12.8 oz (73.8 kg)  09/11/24 160 lb 11.5 oz (72.9 kg)  08/30/24 160 lb  11.2 oz (72.9 kg)     GENERAL:alert, no distress and comfortable SKIN: skin color, texture, turgor are normal, no rashes or significant lesions EYES: normal, Conjunctiva are pink and non-injected, sclera clear NECK: supple, thyroid normal size, non-tender, without nodularity LYMPH:  no palpable lymphadenopathy in the cervical, axillary  LUNGS: clear to auscultation and percussion with normal breathing effort HEART: regular rate & rhythm and no murmurs and no lower extremity edema ABDOMEN:abdomen soft, non-tender and normal bowel sounds Musculoskeletal:no cyanosis of digits and no clubbing  NEURO: alert & oriented x 3 with fluent speech, no focal motor/sensory deficits  Physical Exam    LABORATORY DATA:  I have reviewed the data as listed    Latest Ref Rng & Units 09/13/2024    7:53 AM 09/11/2024    9:50 AM 08/23/2024    9:58 AM  CBC  WBC 4.0 - 10.5 K/uL 8.2  7.2  5.4   Hemoglobin 13.0 - 17.0 g/dL 87.4  86.5  86.6    Hematocrit 39.0 - 52.0 % 36.9  40.6  40.4   Platelets 150 - 400 K/uL 172  195  213         Latest Ref Rng & Units 09/13/2024    7:53 AM 08/23/2024    9:58 AM 07/12/2024   10:38 AM  CMP  Glucose 70 - 99 mg/dL 98  97  896   BUN 8 - 23 mg/dL 16  17  22    Creatinine 0.61 - 1.24 mg/dL 8.91  8.76  9.01   Sodium 135 - 145 mmol/L 136  143  142   Potassium 3.5 - 5.1 mmol/L 4.4  4.6  4.6   Chloride 98 - 111 mmol/L 104  107  107   CO2 22 - 32 mmol/L 22  25  27    Calcium 8.9 - 10.3 mg/dL 9.2  9.9  9.4   Total Protein 6.5 - 8.1 g/dL 6.9  7.6  7.2   Total Bilirubin 0.0 - 1.2 mg/dL 0.6  0.4  0.4   Alkaline Phos 38 - 126 U/L 120  114  73   AST 15 - 41 U/L 21  25  19    ALT 0 - 44 U/L 16  15  20        RADIOGRAPHIC STUDIES: I have personally reviewed the radiological images as listed and agreed with the findings in the report. IR US  LIVER BIOPSY Result Date: 09/11/2024 INDICATION: Anal carcinoma, liver metastases EXAM: ULTRASOUND CORE BIOPSY ANTERIOR LEFT LIVER METASTASIS Date:  09/11/2024 09/11/2024 11:36 am Radiologist:  M. Frederic Specking, MD Guidance:  Ultrasound FLUOROSCOPY: None. MEDICATIONS: 1% lidocaine  local ANESTHESIA/SEDATION: Moderate (conscious) sedation was employed during this procedure. A total of Versed  1.0 mg and Fentanyl  50 mcg was administered intravenously. Moderate Sedation Time: 10 minutes. The patient's level of consciousness and vital signs were monitored continuously by radiology nursing throughout the procedure under my direct supervision. CONTRAST:  None. COMPLICATIONS: None immediate. PROCEDURE: Informed consent was obtained from the patient following explanation of the procedure, risks, benefits and alternatives. The patient understands, agrees and consents for the procedure. All questions were addressed. A time out was performed. Maximal barrier sterile technique utilized including caps, mask, sterile gowns, sterile gloves, large sterile drape, hand hygiene, and ChloraPrep. Previous  imaging reviewed. Preliminary ultrasound performed. A left hepatic lobe anterior metastasis was localized and marked for biopsy. Under sterile conditions and local anesthesia, the 17 gauge guide needle was advanced to the lesion under ultrasound from a subxiphoid approach.  Needle position confirmed with ultrasound. 18 gauge core biopsies obtained through the lesion. These were placed in formalin. Needle tract occluded with Gel-Foam. Postprocedure imaging demonstrates no hemorrhage or hematoma. Patient tolerated biopsy well. IMPRESSION: Successful ultrasound-guided core biopsy of the anterior left hepatic lobe metastasis. Electronically Signed   By: CHRISTELLA.  Shick M.D.   On: 09/11/2024 13:27   IR IMAGING GUIDED PORT INSERTION Result Date: 09/11/2024 CLINICAL DATA:  Metastatic anal carcinoma, access for chemotherapy EXAM: RIGHT INTERNAL JUGULAR SINGLE LUMEN POWER PORT CATHETER INSERTION Date:  09/11/2024 09/11/2024 12:33 pm Radiologist:  CHRISTELLA. Frederic Specking, MD Guidance:  ULTRASOUND AND FLUOROSCOPIC MEDICATIONS: 1% lidocaine  local with epinephrine  ANESTHESIA/SEDATION: Versed  2.5 mg IV; Fentanyl  150 mcg IV; Moderate Sedation Time:  23 minute The patient was continuously monitored during the procedure by the interventional radiology nurse under my direct supervision. FLUOROSCOPY: 0 minutes, 42 seconds (3.0 mGy) COMPLICATIONS: None immediate. CONTRAST:  None. PROCEDURE: Informed consent was obtained from the patient following explanation of the procedure, risks, benefits and alternatives. The patient understands, agrees and consents for the procedure. All questions were addressed. A time out was performed. Maximal barrier sterile technique utilized including caps, mask, sterile gowns, sterile gloves, large sterile drape, hand hygiene, and 2% chlorhexidine scrub. Under sterile conditions and local anesthesia, right internal jugular micropuncture venous access was performed. Access was performed with ultrasound. Images were obtained  for documentation of the patent right internal jugular vein. A guide wire was inserted followed by a transitional dilator. This allowed insertion of a guide wire and catheter into the IVC. Measurements were obtained from the SVC / RA junction back to the right IJ venotomy site. In the right infraclavicular chest, a subcutaneous pocket was created over the second anterior rib. This was done under sterile conditions and local anesthesia. 1% lidocaine  with epinephrine  was utilized for this. A 2.5 cm incision was made in the skin. Blunt dissection was performed to create a subcutaneous pocket over the right pectoralis major muscle. The pocket was flushed with saline vigorously. There was adequate hemostasis. The port catheter was assembled and checked for leakage. The port catheter was secured in the pocket with two retention sutures. The tubing was tunneled subcutaneously to the right venotomy site and inserted into the SVC/RA junction through a valved peel-away sheath. Position was confirmed with fluoroscopy. Images were obtained for documentation. The patient tolerated the procedure well. No immediate complications. Incisions were closed in a two layer fashion with 4 - 0 Vicryl suture. Dermabond was applied to the skin. The port catheter was accessed, blood was aspirated followed by saline and heparin  flushes. Needle was removed. A dry sterile dressing was applied. IMPRESSION: Ultrasound and fluoroscopically guided right internal jugular single lumen power port catheter insertion. Tip in the SVC/RA junction. Catheter ready for use. Electronically Signed   By: CHRISTELLA.  Shick M.D.   On: 09/11/2024 13:22      Orders Placed This Encounter  Procedures   CBC with Differential (Cancer Center Only)    Standing Status:   Future    Expected Date:   11/08/2024    Expiration Date:   11/08/2025   CMP (Cancer Center only)    Standing Status:   Future    Expected Date:   11/08/2024    Expiration Date:   11/08/2025   T4, free     Standing Status:   Future    Expected Date:   11/08/2024    Expiration Date:   11/08/2025   TSH    Standing  Status:   Future    Expected Date:   11/08/2024    Expiration Date:   11/08/2025   CBC with Differential (Cancer Center Only)    Standing Status:   Future    Expected Date:   11/22/2024    Expiration Date:   11/22/2025   CMP (Cancer Center only)    Standing Status:   Future    Expected Date:   11/22/2024    Expiration Date:   11/22/2025   All questions were answered. The patient knows to call the clinic with any problems, questions or concerns. No barriers to learning was detected. The total time spent in the appointment was 30 minutes, including review of chart and various tests results, discussions about plan of care and coordination of care plan     [name], MD 09/13/2024     "

## 2024-09-14 ENCOUNTER — Telehealth: Payer: Self-pay

## 2024-09-14 ENCOUNTER — Other Ambulatory Visit: Payer: Self-pay

## 2024-09-14 NOTE — Telephone Encounter (Signed)
-----   Message from Nurse Leldon NOVAK, RN sent at 09/13/2024  2:21 PM EST ----- Regarding: First time patient First time Retifanlimab , Paclitaxel  and carboplatin . Patient tolerated treatment well with no incidents. Dr. Lanny.

## 2024-09-14 NOTE — Telephone Encounter (Signed)
 LM for patient that this nurse was calling to see how they were doing after their treatment. Please call back to Dr. Latanya Maudlin nurse at [phone] if they have any questions or concerns regarding the treatment.

## 2024-09-14 NOTE — Telephone Encounter (Signed)
 Received vmail from Diane Manard/patient's spouse. Caller stated patient had his port placed and a liver biopsy done yesterday. Caller stated they were told to contact IR if patient experienced certain symptoms. Caller stated patient is c/o of chills. This medical assistant attempted to contact patient's spouse/Diane Schoolfield at T#: [phone]. Unable to reach anyone. LVM w/ IR T#: [phone].

## 2024-09-27 ENCOUNTER — Inpatient Hospital Stay

## 2024-09-27 ENCOUNTER — Inpatient Hospital Stay: Admitting: Hematology

## 2024-10-11 ENCOUNTER — Inpatient Hospital Stay

## 2024-10-11 ENCOUNTER — Inpatient Hospital Stay: Admitting: Hematology

## 2024-10-11 ENCOUNTER — Inpatient Hospital Stay: Attending: Hematology

## 2024-12-04 ENCOUNTER — Ambulatory Visit: Admitting: Cardiology
